# Patient Record
Sex: Female | Born: 1970 | Race: Black or African American | Hispanic: No | Marital: Married | Smoking: Never smoker
Health system: Southern US, Community
[De-identification: ages and names within clinical notes are randomized; demographics above are authoritative.]

## PROBLEM LIST (undated history)

## (undated) DIAGNOSIS — K8020 Calculus of gallbladder without cholecystitis without obstruction: Secondary | ICD-10-CM

## (undated) HISTORY — PX: ECTOPIC PREGNANCY SURGERY: SHX613

---

## 2002-01-25 ENCOUNTER — Emergency Department (HOSPITAL_COMMUNITY): Admission: EM | Admit: 2002-01-25 | Discharge: 2002-01-26 | Payer: Self-pay | Admitting: *Deleted

## 2002-05-14 ENCOUNTER — Emergency Department (HOSPITAL_COMMUNITY): Admission: EM | Admit: 2002-05-14 | Discharge: 2002-05-14 | Payer: Self-pay | Admitting: *Deleted

## 2002-07-03 ENCOUNTER — Inpatient Hospital Stay (HOSPITAL_COMMUNITY): Admission: AD | Admit: 2002-07-03 | Discharge: 2002-07-03 | Payer: Self-pay | Admitting: Family Medicine

## 2002-10-25 ENCOUNTER — Emergency Department (HOSPITAL_COMMUNITY): Admission: EM | Admit: 2002-10-25 | Discharge: 2002-10-25 | Payer: Self-pay | Admitting: Emergency Medicine

## 2003-01-06 ENCOUNTER — Emergency Department (HOSPITAL_COMMUNITY): Admission: EM | Admit: 2003-01-06 | Discharge: 2003-01-06 | Payer: Self-pay | Admitting: Emergency Medicine

## 2003-06-06 ENCOUNTER — Emergency Department (HOSPITAL_COMMUNITY): Admission: EM | Admit: 2003-06-06 | Discharge: 2003-06-06 | Payer: Self-pay | Admitting: Emergency Medicine

## 2003-07-10 ENCOUNTER — Emergency Department (HOSPITAL_COMMUNITY): Admission: EM | Admit: 2003-07-10 | Discharge: 2003-07-11 | Payer: Self-pay | Admitting: Emergency Medicine

## 2003-07-25 ENCOUNTER — Emergency Department (HOSPITAL_COMMUNITY): Admission: EM | Admit: 2003-07-25 | Discharge: 2003-07-25 | Payer: Self-pay | Admitting: Emergency Medicine

## 2003-09-26 ENCOUNTER — Emergency Department (HOSPITAL_COMMUNITY): Admission: EM | Admit: 2003-09-26 | Discharge: 2003-09-26 | Payer: Self-pay | Admitting: Emergency Medicine

## 2003-10-09 ENCOUNTER — Emergency Department (HOSPITAL_COMMUNITY): Admission: EM | Admit: 2003-10-09 | Discharge: 2003-10-09 | Payer: Self-pay | Admitting: Emergency Medicine

## 2003-12-18 ENCOUNTER — Emergency Department (HOSPITAL_COMMUNITY): Admission: EM | Admit: 2003-12-18 | Discharge: 2003-12-18 | Payer: Self-pay | Admitting: Emergency Medicine

## 2003-12-20 ENCOUNTER — Emergency Department (HOSPITAL_COMMUNITY): Admission: EM | Admit: 2003-12-20 | Discharge: 2003-12-20 | Payer: Self-pay | Admitting: Family Medicine

## 2003-12-30 ENCOUNTER — Emergency Department (HOSPITAL_COMMUNITY): Admission: EM | Admit: 2003-12-30 | Discharge: 2003-12-30 | Payer: Self-pay | Admitting: Emergency Medicine

## 2003-12-31 ENCOUNTER — Emergency Department (HOSPITAL_COMMUNITY): Admission: EM | Admit: 2003-12-31 | Discharge: 2003-12-31 | Payer: Self-pay | Admitting: *Deleted

## 2004-09-27 ENCOUNTER — Emergency Department (HOSPITAL_COMMUNITY): Admission: EM | Admit: 2004-09-27 | Discharge: 2004-09-28 | Payer: Self-pay | Admitting: Emergency Medicine

## 2004-11-08 ENCOUNTER — Encounter: Admission: RE | Admit: 2004-11-08 | Discharge: 2004-11-08 | Payer: Self-pay | Admitting: Family Medicine

## 2005-04-20 ENCOUNTER — Emergency Department (HOSPITAL_COMMUNITY): Admission: EM | Admit: 2005-04-20 | Discharge: 2005-04-20 | Payer: Self-pay | Admitting: Emergency Medicine

## 2005-08-17 ENCOUNTER — Emergency Department (HOSPITAL_COMMUNITY): Admission: EM | Admit: 2005-08-17 | Discharge: 2005-08-17 | Payer: Self-pay | Admitting: Emergency Medicine

## 2006-03-27 ENCOUNTER — Emergency Department (HOSPITAL_COMMUNITY): Admission: EM | Admit: 2006-03-27 | Discharge: 2006-03-27 | Payer: Self-pay | Admitting: Emergency Medicine

## 2006-04-15 ENCOUNTER — Emergency Department (HOSPITAL_COMMUNITY): Admission: EM | Admit: 2006-04-15 | Discharge: 2006-04-15 | Payer: Self-pay | Admitting: Family Medicine

## 2008-01-23 ENCOUNTER — Emergency Department (HOSPITAL_COMMUNITY): Admission: EM | Admit: 2008-01-23 | Discharge: 2008-01-23 | Payer: Self-pay | Admitting: Emergency Medicine

## 2008-04-17 ENCOUNTER — Emergency Department (HOSPITAL_COMMUNITY): Admission: EM | Admit: 2008-04-17 | Discharge: 2008-04-17 | Payer: Self-pay | Admitting: Emergency Medicine

## 2008-06-01 ENCOUNTER — Emergency Department (HOSPITAL_COMMUNITY): Admission: EM | Admit: 2008-06-01 | Discharge: 2008-06-01 | Payer: Self-pay | Admitting: Emergency Medicine

## 2008-09-28 ENCOUNTER — Emergency Department (HOSPITAL_COMMUNITY): Admission: EM | Admit: 2008-09-28 | Discharge: 2008-09-28 | Payer: Self-pay | Admitting: Family Medicine

## 2008-10-05 ENCOUNTER — Emergency Department (HOSPITAL_COMMUNITY): Admission: EM | Admit: 2008-10-05 | Discharge: 2008-10-05 | Payer: Self-pay | Admitting: Internal Medicine

## 2008-11-27 ENCOUNTER — Emergency Department (HOSPITAL_COMMUNITY): Admission: EM | Admit: 2008-11-27 | Discharge: 2008-11-27 | Payer: Self-pay | Admitting: Emergency Medicine

## 2009-02-01 ENCOUNTER — Emergency Department (HOSPITAL_COMMUNITY): Admission: EM | Admit: 2009-02-01 | Discharge: 2009-02-01 | Payer: Self-pay | Admitting: Emergency Medicine

## 2009-02-12 ENCOUNTER — Emergency Department (HOSPITAL_COMMUNITY): Admission: EM | Admit: 2009-02-12 | Discharge: 2009-02-12 | Payer: Self-pay | Admitting: Emergency Medicine

## 2009-05-01 ENCOUNTER — Emergency Department (HOSPITAL_COMMUNITY): Admission: EM | Admit: 2009-05-01 | Discharge: 2009-05-01 | Payer: Self-pay | Admitting: Emergency Medicine

## 2009-08-19 ENCOUNTER — Emergency Department (HOSPITAL_COMMUNITY): Admission: EM | Admit: 2009-08-19 | Discharge: 2009-08-19 | Payer: Self-pay | Admitting: Emergency Medicine

## 2010-01-09 ENCOUNTER — Emergency Department (HOSPITAL_COMMUNITY): Admission: EM | Admit: 2010-01-09 | Discharge: 2010-01-09 | Payer: Self-pay | Admitting: Emergency Medicine

## 2010-05-17 ENCOUNTER — Emergency Department (HOSPITAL_COMMUNITY): Admission: EM | Admit: 2010-05-17 | Discharge: 2010-05-17 | Payer: Self-pay | Admitting: Emergency Medicine

## 2010-10-26 LAB — DIFFERENTIAL
Basophils Relative: 0 % (ref 0–1)
Eosinophils Absolute: 0.3 10*3/uL (ref 0.0–0.7)
Eosinophils Relative: 4 % (ref 0–5)
Lymphs Abs: 1.6 10*3/uL (ref 0.7–4.0)
Monocytes Absolute: 0.5 10*3/uL (ref 0.1–1.0)

## 2010-10-26 LAB — POCT CARDIAC MARKERS
CKMB, poc: <1 ng/mL — ABNORMAL LOW (ref 1.0–8.0)
Myoglobin, poc: 31.8 ng/mL (ref 12–200)
Troponin i, poc: <0.05 ng/mL (ref 0.00–0.09)

## 2010-10-26 LAB — CBC: Platelets: 311 10*3/uL (ref 150–400)

## 2010-10-26 LAB — POCT I-STAT, CHEM 8
BUN: 13 mg/dL (ref 6–23)
Chloride: 105 mEq/L (ref 96–112)
HCT: 34 % — ABNORMAL LOW (ref 36.0–46.0)
Potassium: 4.4 mEq/L (ref 3.5–5.1)
Sodium: 135 mEq/L (ref 135–145)
TCO2: 23 mmol/L (ref 0–100)

## 2010-10-29 LAB — DIFFERENTIAL
Basophils Absolute: 0.1 10*3/uL (ref 0.0–0.1)
Eosinophils Relative: 4 % (ref 0–5)
Lymphocytes Relative: 33 % (ref 12–46)
Lymphs Abs: 1.4 10*3/uL (ref 0.7–4.0)
Monocytes Absolute: 0.4 10*3/uL (ref 0.1–1.0)
Monocytes Absolute: 0.5 10*3/uL (ref 0.1–1.0)
Monocytes Relative: 7 % (ref 3–12)
Neutro Abs: 5.7 10*3/uL (ref 1.7–7.7)
Neutrophils Relative %: 74 % (ref 43–77)

## 2010-10-29 LAB — CBC
HCT: 34.4 % — ABNORMAL LOW (ref 36.0–46.0)
HCT: 36.9 % (ref 36.0–46.0)
Hemoglobin: 11.3 g/dL — ABNORMAL LOW (ref 12.0–15.0)
Hemoglobin: 12.5 g/dL (ref 12.0–15.0)
MCHC: 33.7 g/dL (ref 30.0–36.0)
MCV: 88.9 fL (ref 78.0–100.0)
Platelets: 290 10*3/uL (ref 150–400)
RBC: 4.16 MIL/uL (ref 3.87–5.11)
RDW: 14.3 % (ref 11.5–15.5)
RDW: 13.6 % (ref 11.5–15.5)
WBC: 7.7 10*3/uL (ref 4.0–10.5)
WBC: 6.7 10*3/uL (ref 4.0–10.5)

## 2010-10-29 LAB — COMPREHENSIVE METABOLIC PANEL
Alkaline Phosphatase: 43 U/L (ref 39–117)
BUN: 8 mg/dL (ref 6–23)
GFR calc Af Amer: >60 mL/min (ref >60)
Total Bilirubin: 0.7 mg/dL (ref 0.3–1.2)
Total Protein: 6.6 g/dL (ref 6.0–8.3)

## 2010-10-29 LAB — BASIC METABOLIC PANEL
BUN: 9 mg/dL (ref 6–23)
Calcium: 8.8 mg/dL (ref 8.4–10.5)
Chloride: 103 mEq/L (ref 96–112)
Creatinine, Ser: 0.69 mg/dL (ref 0.4–1.2)
GFR calc non Af Amer: >60 mL/min (ref >60)
Glucose, Bld: 87 mg/dL (ref 70–99)
Potassium: 3.5 mEq/L (ref 3.5–5.1)

## 2010-10-29 LAB — D-DIMER, QUANTITATIVE: D-Dimer, Quant: 0.43 ug/mL-FEU (ref 0.00–0.48)

## 2010-10-29 LAB — POCT CARDIAC MARKERS
CKMB, poc: <1 ng/mL — ABNORMAL LOW (ref 1.0–8.0)
Myoglobin, poc: 41.9 ng/mL (ref 12–200)
Myoglobin, poc: 43.8 ng/mL (ref 12–200)
Troponin i, poc: <0.05 ng/mL (ref 0.00–0.09)
Troponin i, poc: <0.05 ng/mL (ref 0.00–0.09)

## 2010-10-31 LAB — POCT I-STAT, CHEM 8
BUN: 10 mg/dL (ref 6–23)
Chloride: 104 mEq/L (ref 96–112)
Creatinine, Ser: 0.9 mg/dL (ref 0.4–1.2)
Sodium: 138 mEq/L (ref 135–145)

## 2010-10-31 LAB — URINALYSIS, ROUTINE W REFLEX MICROSCOPIC
Glucose, UA: NEGATIVE mg/dL
Hgb urine dipstick: NEGATIVE
pH: 7 (ref 5.0–8.0)

## 2010-10-31 LAB — DIFFERENTIAL
Basophils Absolute: 0 10*3/uL (ref 0.0–0.1)
Lymphocytes Relative: 14 % (ref 12–46)
Lymphs Abs: 1 10*3/uL (ref 0.7–4.0)
Monocytes Absolute: 0.4 10*3/uL (ref 0.1–1.0)
Neutro Abs: 5.8 10*3/uL (ref 1.7–7.7)

## 2010-10-31 LAB — CBC
Hemoglobin: 12.9 g/dL (ref 12.0–15.0)
RDW: 13.8 % (ref 11.5–15.5)
WBC: 7.2 10*3/uL (ref 4.0–10.5)

## 2010-10-31 LAB — POCT PREGNANCY, URINE: Preg Test, Ur: NEGATIVE

## 2010-11-02 LAB — POCT PREGNANCY, URINE: Preg Test, Ur: NEGATIVE

## 2010-11-06 ENCOUNTER — Inpatient Hospital Stay (INDEPENDENT_AMBULATORY_CARE_PROVIDER_SITE_OTHER)
Admission: RE | Admit: 2010-11-06 | Discharge: 2010-11-06 | Disposition: A | Discharge status: Home / Self Care | Payer: Self-pay | Source: Ambulatory Visit | Attending: Emergency Medicine | Admitting: Emergency Medicine

## 2010-11-06 DIAGNOSIS — R112 Nausea with vomiting, unspecified: Secondary | ICD-10-CM

## 2010-11-06 DIAGNOSIS — J40 Bronchitis, not specified as acute or chronic: Secondary | ICD-10-CM

## 2010-11-06 LAB — POCT URINALYSIS DIP (DEVICE)
Bilirubin Urine: NEGATIVE
Glucose, UA: NEGATIVE mg/dL
Ketones, ur: NEGATIVE mg/dL

## 2010-11-06 LAB — POCT PREGNANCY, URINE: Preg Test, Ur: NEGATIVE

## 2011-02-12 ENCOUNTER — Inpatient Hospital Stay (HOSPITAL_COMMUNITY)
Admission: RE | Admit: 2011-02-12 | Discharge: 2011-02-12 | Disposition: A | Discharge status: Home / Self Care | Payer: Self-pay | Source: Ambulatory Visit | Attending: Family Medicine | Admitting: Family Medicine

## 2011-04-10 ENCOUNTER — Emergency Department (HOSPITAL_COMMUNITY): Payer: Self-pay

## 2011-04-10 ENCOUNTER — Emergency Department (HOSPITAL_COMMUNITY)
Admission: EM | Admit: 2011-04-10 | Discharge: 2011-04-10 | Disposition: A | Discharge status: Home / Self Care | Payer: Self-pay | Attending: Emergency Medicine | Admitting: Emergency Medicine

## 2011-04-10 DIAGNOSIS — K2970 Gastritis, unspecified, without bleeding: Secondary | ICD-10-CM | POA: Insufficient documentation

## 2011-04-10 DIAGNOSIS — R109 Unspecified abdominal pain: Secondary | ICD-10-CM | POA: Insufficient documentation

## 2011-04-10 DIAGNOSIS — R112 Nausea with vomiting, unspecified: Secondary | ICD-10-CM | POA: Insufficient documentation

## 2011-04-10 LAB — LIPASE, BLOOD: Lipase: 27 U/L (ref 11–59)

## 2011-04-10 LAB — COMPREHENSIVE METABOLIC PANEL
Albumin: 3.6 g/dL (ref 3.5–5.2)
Alkaline Phosphatase: 58 U/L (ref 39–117)
BUN: 8 mg/dL (ref 6–23)
Chloride: 103 mEq/L (ref 96–112)
Potassium: 4.1 mEq/L (ref 3.5–5.1)
Total Bilirubin: 0.7 mg/dL (ref 0.3–1.2)

## 2011-04-10 LAB — URINALYSIS, ROUTINE W REFLEX MICROSCOPIC
Bilirubin Urine: NEGATIVE
Glucose, UA: NEGATIVE mg/dL
Ketones, ur: NEGATIVE mg/dL
pH: 5.5 (ref 5.0–8.0)

## 2011-04-10 LAB — DIFFERENTIAL
Basophils Absolute: 0.1 10*3/uL (ref 0.0–0.1)
Eosinophils Relative: 4 % (ref 0–5)
Lymphocytes Relative: 32 % (ref 12–46)
Neutro Abs: 3 10*3/uL (ref 1.7–7.7)
Neutrophils Relative %: 56 % (ref 43–77)

## 2011-04-10 LAB — CBC
HCT: 34.9 % — ABNORMAL LOW (ref 36.0–46.0)
Hemoglobin: 11.3 g/dL — ABNORMAL LOW (ref 12.0–15.0)
RBC: 4.23 MIL/uL (ref 3.87–5.11)
RDW: 14.8 % (ref 11.5–15.5)
WBC: 5.3 10*3/uL (ref 4.0–10.5)

## 2011-04-19 LAB — POCT URINALYSIS DIP (DEVICE)
Bilirubin Urine: NEGATIVE
Glucose, UA: NEGATIVE
Ketones, ur: NEGATIVE
Operator id: 235561
Specific Gravity, Urine: 1.015

## 2011-04-23 LAB — POCT PREGNANCY, URINE: Preg Test, Ur: NEGATIVE

## 2011-04-24 LAB — POCT URINALYSIS DIP (DEVICE)
Bilirubin Urine: NEGATIVE
Glucose, UA: NEGATIVE mg/dL
Nitrite: NEGATIVE

## 2011-04-24 LAB — POCT PREGNANCY, URINE: Preg Test, Ur: NEGATIVE

## 2011-04-24 LAB — GC/CHLAMYDIA PROBE AMP, GENITAL: Chlamydia, DNA Probe: NEGATIVE

## 2011-04-24 LAB — WET PREP, GENITAL: Yeast Wet Prep HPF POC: NONE SEEN

## 2011-05-17 ENCOUNTER — Emergency Department (HOSPITAL_COMMUNITY)
Admission: EM | Admit: 2011-05-17 | Discharge: 2011-05-17 | Disposition: A | Discharge status: Home / Self Care | Payer: Self-pay | Attending: Emergency Medicine | Admitting: Emergency Medicine

## 2011-05-17 ENCOUNTER — Emergency Department (HOSPITAL_COMMUNITY): Payer: Self-pay

## 2011-05-17 DIAGNOSIS — R10811 Right upper quadrant abdominal tenderness: Secondary | ICD-10-CM | POA: Insufficient documentation

## 2011-05-17 DIAGNOSIS — R109 Unspecified abdominal pain: Secondary | ICD-10-CM | POA: Insufficient documentation

## 2011-05-17 LAB — COMPREHENSIVE METABOLIC PANEL
AST: 13 U/L (ref 0–37)
BUN: 10 mg/dL (ref 6–23)
CO2: 22 mEq/L (ref 19–32)
Calcium: 9.1 mg/dL (ref 8.4–10.5)
Creatinine, Ser: 0.65 mg/dL (ref 0.50–1.10)
GFR calc Af Amer: >90 mL/min (ref >90)
GFR calc non Af Amer: >90 mL/min (ref >90)
Total Bilirubin: 0.3 mg/dL (ref 0.3–1.2)

## 2011-05-17 LAB — CBC
HCT: 34.5 % — ABNORMAL LOW (ref 36.0–46.0)
MCH: 27.3 pg (ref 26.0–34.0)
MCV: 82.7 fL (ref 78.0–100.0)
Platelets: 301 10*3/uL (ref 150–400)
RBC: 4.17 MIL/uL (ref 3.87–5.11)

## 2011-05-17 LAB — PREGNANCY, URINE: Preg Test, Ur: NEGATIVE

## 2011-05-17 LAB — URINALYSIS, ROUTINE W REFLEX MICROSCOPIC
Leukocytes, UA: NEGATIVE
Nitrite: NEGATIVE
Specific Gravity, Urine: 1.027 (ref 1.005–1.030)
Urobilinogen, UA: 0.2 mg/dL (ref 0.0–1.0)
pH: 5.5 (ref 5.0–8.0)

## 2011-05-17 LAB — DIFFERENTIAL
Eosinophils Absolute: 0.2 10*3/uL (ref 0.0–0.7)
Eosinophils Relative: 3 % (ref 0–5)
Lymphocytes Relative: 31 % (ref 12–46)
Lymphs Abs: 1.9 10*3/uL (ref 0.7–4.0)
Monocytes Relative: 6 % (ref 3–12)
Neutrophils Relative %: 59 % (ref 43–77)

## 2011-05-17 MED ORDER — IOHEXOL 300 MG/ML  SOLN
80.0000 mL | Freq: Once | INTRAMUSCULAR | Status: AC | PRN
  Administered 2011-05-17: 80 mL via INTRAVENOUS

## 2011-06-04 ENCOUNTER — Emergency Department (HOSPITAL_COMMUNITY): Payer: Self-pay

## 2011-06-04 ENCOUNTER — Emergency Department (HOSPITAL_COMMUNITY)
Admission: EM | Admit: 2011-06-04 | Discharge: 2011-06-04 | Disposition: A | Discharge status: Home / Self Care | Payer: Self-pay | Attending: Emergency Medicine | Admitting: Emergency Medicine

## 2011-06-04 ENCOUNTER — Telehealth (HOSPITAL_COMMUNITY): Payer: Self-pay | Admitting: Emergency Medicine

## 2011-06-04 ENCOUNTER — Ambulatory Visit (INDEPENDENT_AMBULATORY_CARE_PROVIDER_SITE_OTHER): Payer: Self-pay | Admitting: General Surgery

## 2011-06-04 DIAGNOSIS — K279 Peptic ulcer, site unspecified, unspecified as acute or chronic, without hemorrhage or perforation: Secondary | ICD-10-CM | POA: Insufficient documentation

## 2011-06-04 DIAGNOSIS — R1013 Epigastric pain: Secondary | ICD-10-CM | POA: Insufficient documentation

## 2011-06-04 DIAGNOSIS — R112 Nausea with vomiting, unspecified: Secondary | ICD-10-CM | POA: Insufficient documentation

## 2011-06-04 HISTORY — DX: Calculus of gallbladder without cholecystitis without obstruction: K80.20

## 2011-06-04 LAB — COMPREHENSIVE METABOLIC PANEL
ALT: 7 U/L (ref 0–35)
Albumin: 3.5 g/dL (ref 3.5–5.2)
Alkaline Phosphatase: 49 U/L (ref 39–117)
BUN: 7 mg/dL (ref 6–23)
Chloride: 102 mEq/L (ref 96–112)
Glucose, Bld: 102 mg/dL — ABNORMAL HIGH (ref 70–99)
Potassium: 3.8 mEq/L (ref 3.5–5.1)
Sodium: 135 mEq/L (ref 135–145)
Total Bilirubin: 0.7 mg/dL (ref 0.3–1.2)
Total Protein: 7.1 g/dL (ref 6.0–8.3)

## 2011-06-04 LAB — CBC
Hemoglobin: 10.8 g/dL — ABNORMAL LOW (ref 12.0–15.0)
Platelets: 274 10*3/uL (ref 150–400)
RBC: 4.04 MIL/uL (ref 3.87–5.11)
WBC: 5.1 10*3/uL (ref 4.0–10.5)

## 2011-06-04 LAB — URINALYSIS, ROUTINE W REFLEX MICROSCOPIC
Bilirubin Urine: NEGATIVE
Hgb urine dipstick: NEGATIVE
Ketones, ur: NEGATIVE mg/dL
Nitrite: NEGATIVE
Specific Gravity, Urine: 1.013 (ref 1.005–1.030)
pH: 6 (ref 5.0–8.0)

## 2011-06-04 LAB — DIFFERENTIAL
Lymphs Abs: 1.5 10*3/uL (ref 0.7–4.0)
Monocytes Relative: 8 % (ref 3–12)
Neutro Abs: 3 10*3/uL (ref 1.7–7.7)
Neutrophils Relative %: 58 % (ref 43–77)

## 2011-06-04 LAB — LIPASE, BLOOD: Lipase: 25 U/L (ref 11–59)

## 2011-06-04 MED ORDER — MORPHINE SULFATE 4 MG/ML IJ SOLN
6.0000 mg | Freq: Once | INTRAMUSCULAR | Status: AC
  Administered 2011-06-04: 6 mg via INTRAVENOUS
  Filled 2011-06-04: qty 2

## 2011-06-04 MED ORDER — ONDANSETRON HCL 4 MG/2ML IJ SOLN
4.0000 mg | Freq: Once | INTRAMUSCULAR | Status: AC
  Administered 2011-06-04: 4 mg via INTRAVENOUS
  Filled 2011-06-04: qty 2

## 2011-06-04 MED ORDER — GI COCKTAIL ~~LOC~~
30.0000 mL | Freq: Once | ORAL | Status: AC
  Administered 2011-06-04: 30 mL via ORAL
  Filled 2011-06-04: qty 30

## 2011-06-04 MED ORDER — OMEPRAZOLE 20 MG PO CPDR: 40.0000 mg | DELAYED_RELEASE_CAPSULE | Freq: Every day | ORAL | Status: DC

## 2011-06-04 MED ORDER — HYDROCODONE-ACETAMINOPHEN 5-325 MG PO TABS: 1.0000 | ORAL_TABLET | ORAL | Status: AC | PRN

## 2011-06-04 MED ORDER — SODIUM CHLORIDE 0.9 % IV SOLN
Freq: Once | INTRAVENOUS | Status: AC
  Administered 2011-06-04: 12:00:00 via INTRAVENOUS

## 2011-06-04 MED ORDER — SODIUM CHLORIDE 0.9 % IV BOLUS (SEPSIS)
1000.0000 mL | Freq: Once | INTRAVENOUS | Status: AC
  Administered 2011-06-04: 1000 mL via INTRAVENOUS

## 2011-06-04 MED ORDER — ONDANSETRON HCL 4 MG PO TABS: 4.0000 mg | ORAL_TABLET | Freq: Four times a day (QID) | ORAL | Status: AC

## 2011-06-04 MED ORDER — FENTANYL CITRATE 0.05 MG/ML IJ SOLN
75.0000 ug | Freq: Once | INTRAMUSCULAR | Status: AC
  Administered 2011-06-04: 75 ug via INTRAVENOUS
  Filled 2011-06-04: qty 2

## 2011-06-04 MED ORDER — HYDROMORPHONE HCL PF 1 MG/ML IJ SOLN
0.5000 mg | Freq: Once | INTRAMUSCULAR | Status: AC
  Administered 2011-06-04: 0.5 mg via INTRAVENOUS
  Filled 2011-06-04: qty 1

## 2011-06-04 MED ORDER — OXYCODONE-ACETAMINOPHEN 5-325 MG PO TABS
1.0000 | ORAL_TABLET | Freq: Once | ORAL | Status: AC
  Administered 2011-06-04: 1 via ORAL
  Filled 2011-06-04: qty 1

## 2011-06-04 MED ORDER — PANTOPRAZOLE SODIUM 40 MG IV SOLR
40.0000 mg | Freq: Once | INTRAVENOUS | Status: AC
  Administered 2011-06-04: 40 mg via INTRAVENOUS
  Filled 2011-06-04: qty 40

## 2011-06-04 NOTE — ED Notes (Signed)
Pt. Returned from U/S via w/c, NAD noted

## 2011-06-04 NOTE — ED Notes (Signed)
Patient presents with bilateral upper quadrant pain x 1 month. Patient is to have her gall bladder removed in 3 weeks but reports pain is unbearable. Denies urinary symptoms.

## 2011-06-04 NOTE — ED Provider Notes (Signed)
History     CSN: [id number] Arrival date & time: 06/04/2011  9:14 AM   First MD Initiated Contact with Patient 06/04/11 [id number]      Chief Complaint  Patient presents with  . Abdominal Pain    (Consider location/radiation/quality/duration/timing/severity/associated sxs/prior treatment) HPI Comments: Patient presents with persistent and worsening upper abdominal pain over the last one month.  She states she was here at the end of October and diagnosed with gallstones.  She was given followup with the [facility] and was supposed to see them in office today.  She called them and stated that her pain was severe and they prompted her to come to the emergency department.  The patient had been under the impression she would have surgery today.  Patient denies any fevers.  She states that her pain is primarily in the epigastric and right upper quadrant area.  It can worsen with food but occurs even without eating.  She's had associated nausea and vomiting without any hematemesis.  Her bowel movements have been normal.  No hematuria or dysuria.  Her last menstrual period was November 1.  Patient is a 40 y.o. female presenting with abdominal pain. The history is provided by the patient. No language interpreter was used.  Abdominal Pain The primary symptoms of the illness include abdominal pain, nausea and vomiting. The primary symptoms of the illness do not include fever, fatigue, shortness of breath, diarrhea, hematemesis, hematochezia, dysuria, vaginal discharge or vaginal bleeding. The current episode started more than 2 days ago. The onset of the illness was gradual. The problem has been gradually worsening.  The patient states that she believes she is currently not pregnant. The patient has not had a change in bowel habit. Symptoms associated with the illness do not include chills, anorexia, diaphoresis, heartburn, constipation, urgency, hematuria, frequency or back pain. Significant  associated medical issues include gallstones.    Past Medical History  Diagnosis Date  . Gallstones     Past Surgical History  Procedure Date  . Ectopic pregnancy surgery     History reviewed. No pertinent family history.  History  Substance Use Topics  . Smoking status: Never Smoker   . Smokeless tobacco: Not on file  . Alcohol Use: No    OB History    Grav Para Term Preterm Abortions TAB SAB Ect Mult Living                  Review of Systems  Constitutional: Negative.  Negative for fever, chills, diaphoresis and fatigue.  HENT: Negative.   Eyes: Negative.  Negative for discharge and redness.  Respiratory: Negative.  Negative for cough and shortness of breath.   Cardiovascular: Negative.  Negative for chest pain.  Gastrointestinal: Positive for nausea, vomiting and abdominal pain. Negative for heartburn, diarrhea, constipation, hematochezia, anorexia and hematemesis.  Genitourinary: Negative.  Negative for dysuria, urgency, frequency, hematuria, vaginal bleeding and vaginal discharge.  Musculoskeletal: Negative.  Negative for back pain.  Skin: Negative.  Negative for color change and rash.  Neurological: Negative.  Negative for syncope and headaches.  Hematological: Negative.  Negative for adenopathy.  Psychiatric/Behavioral: Negative.  Negative for confusion.  All other systems reviewed and are negative.    Allergies  Codeine  Home Medications   Current Outpatient Rx  Name Route Sig Dispense Refill  . ALEVE PO Oral Take 4 tablets by mouth 2 (two) times daily as needed. For pain.       BP 112/66  Pulse 65  Temp(Src) 98 F (36.7 C) (Oral)  Resp 18  SpO2 100%  LMP 05/24/2011  Physical Exam  Constitutional: She is oriented to person, place, and time. She appears well-developed and well-nourished.  Non-toxic appearance. She does not have a sickly appearance.  HENT:  Head: Normocephalic and atraumatic.  Eyes: Conjunctivae, EOM and lids are normal. Pupils  are equal, round, and reactive to light. No scleral icterus.  Neck: Trachea normal and normal range of motion. Neck supple.  Cardiovascular: Normal rate, regular rhythm and normal heart sounds.  Exam reveals no gallop and no friction rub.   No murmur heard. Pulmonary/Chest: Effort normal and breath sounds normal. No respiratory distress. She has no wheezes. She has no rales. She exhibits no tenderness.  Abdominal: Soft. Normal appearance. There is tenderness in the right upper quadrant and epigastric area. There is positive Murphy's sign. There is no rebound, no guarding and no CVA tenderness.  Musculoskeletal: Normal range of motion.  Neurological: She is alert and oriented to person, place, and time. She has normal strength.  Skin: Skin is warm, dry and intact. No rash noted.  Psychiatric: She has a normal mood and affect. Her behavior is normal. Judgment and thought content normal.    ED Course  Procedures (including critical care time)  Results for orders placed during the hospital encounter of 06/04/11  CBC      Component Value Range   WBC 5.1  4.0 - 10.5 (K/uL)   RBC 4.04  3.87 - 5.11 (MIL/uL)   Hemoglobin 10.8 (*) 12.0 - 15.0 (g/dL)   HCT 45.4 (*) 09.8 - 46.0 (%)   MCV 82.7  78.0 - 100.0 (fL)   MCH 26.7  26.0 - 34.0 (pg)   MCHC 32.3  30.0 - 36.0 (g/dL)   RDW 11.9  14.7 - 82.9 (%)   Platelets 274  150 - 400 (K/uL)  DIFFERENTIAL      Component Value Range   Neutrophils Relative 58  43 - 77 (%)   Neutro Abs 3.0  1.7 - 7.7 (K/uL)   Lymphocytes Relative 30  12 - 46 (%)   Lymphs Abs 1.5  0.7 - 4.0 (K/uL)   Monocytes Relative 8  3 - 12 (%)   Monocytes Absolute 0.4  0.1 - 1.0 (K/uL)   Eosinophils Relative 3  0 - 5 (%)   Eosinophils Absolute 0.2  0.0 - 0.7 (K/uL)   Basophils Relative 1  0 - 1 (%)   Basophils Absolute 0.0  0.0 - 0.1 (K/uL)  COMPREHENSIVE METABOLIC PANEL      Component Value Range   Sodium 135  135 - 145 (mEq/L)   Potassium 3.8  3.5 - 5.1 (mEq/L)   Chloride 102   96 - 112 (mEq/L)   CO2 26  19 - 32 (mEq/L)   Glucose, Bld 102 (*) 70 - 99 (mg/dL)   BUN 7  6 - 23 (mg/dL)   Creatinine, Ser 5.62  0.50 - 1.10 (mg/dL)   Calcium 9.2  8.4 - 13.0 (mg/dL)   Total Protein 7.1  6.0 - 8.3 (g/dL)   Albumin 3.5  3.5 - 5.2 (g/dL)   AST 11  0 - 37 (U/L)   ALT 7  0 - 35 (U/L)   Alkaline Phosphatase 49  39 - 117 (U/L)   Total Bilirubin 0.7  0.3 - 1.2 (mg/dL)   GFR calc non Af Amer >90  >90 (mL/min)   GFR calc Af Amer >90  >90 (mL/min)  LIPASE, BLOOD  Component Value Range   Lipase 25  11 - 59 (U/L)  URINALYSIS, ROUTINE W REFLEX MICROSCOPIC      Component Value Range   Color, Urine YELLOW  YELLOW    Appearance CLEAR  CLEAR    Specific Gravity, Urine 1.013  1.005 - 1.030    pH 6.0  5.0 - 8.0    Glucose, UA NEGATIVE  NEGATIVE (mg/dL)   Hgb urine dipstick NEGATIVE  NEGATIVE    Bilirubin Urine NEGATIVE  NEGATIVE    Ketones, ur NEGATIVE  NEGATIVE (mg/dL)   Protein, ur NEGATIVE  NEGATIVE (mg/dL)   Urobilinogen, UA 0.2  0.0 - 1.0 (mg/dL)   Nitrite NEGATIVE  NEGATIVE    Leukocytes, UA NEGATIVE  NEGATIVE   PREGNANCY, URINE      Component Value Range   Preg Test, Ur NEGATIVE     US Abdomen Complete  06/04/2011  *RADIOLOGY REPORT*  Clinical Data:  Rule out cholecystitis.  Pain.  ABDOMINAL ULTRASOUND COMPLETE  Comparison:  05/17/2011  Findings:  Gallbladder:  No gallstones, gallbladder wall thickening, or pericholecystic fluid.  Common Bile Duct:  Within normal limits in caliber.  Liver: No focal mass lesion identified.  Within normal limits in parenchymal echogenicity.  IVC:  Appears normal.  Pancreas:  No abnormality identified.  Spleen:  Within normal limits in size and echotexture.  Right kidney:  Normal in size and parenchymal echogenicity.  No evidence of mass or hydronephrosis.  Left kidney:  Normal in size and parenchymal echogenicity.  No evidence of mass or hydronephrosis.  Abdominal Aorta:  No aneurysm identified.  IMPRESSION: Negative abdominal  ultrasound.  Original Report Authenticated By: Rosealee Albee, M.D.   Ct Abdomen Pelvis W Contrast  05/17/2011  *RADIOLOGY REPORT*  Clinical Data: Upper abdominal pain for 1 week.  CT ABDOMEN AND PELVIS WITH CONTRAST  Technique:  Multidetector CT imaging of the abdomen and pelvis was performed following the standard protocol during bolus administration of intravenous contrast.  Contrast: 80mL OMNIPAQUE IOHEXOL 300 MG/ML IV SOLN  Comparison: None.  Findings: The lung bases are clear.  There is no pleural or pericardial effusion.  There is some high-attenuation material in the fundus of the gallbladder compatible with the presence of sludge or possibly some small stones.  No pericholecystic fluid or other evidence of inflammatory change about the gallbladder is identified.  The liver, biliary tree, adrenal glands, spleen, pancreas and left kidney are unremarkable.  A small low attenuating lesion in the lower pole the right kidney is most consistent with a cyst.  There is a small amount of free pelvic fluid. There is a rounded centrally low attenuating focus in the right adnexa with an appearance compatible with an involuting ovarian cyst measuring 1.4 cm in diameter.  No lymphadenopathy is identified.  The stomach, small large bowel and appendix appear normal.  IMPRESSION:  1.  Small amount of free pelvic fluid is likely due to ruptured right ovarian cyst. 2.  Findings compatible with small gallstones or sludge.  No evidence of cholecystitis.  Original Report Authenticated By: Bernadene Bell. D'ALESSIO, M.D.     MDM  With symptoms concerning for possible cholecystitis versus symptomatic cholelithiasis.  On evaluation of today's ultrasound she does not have any signs of gallbladder sludge or gallstones.  Furthermore there are no findings for acute cholecystitis and no LFT elevations or lipase elevations to indicate pancreatitis.  Patient's pain may be due to peptic ulcer disease.  Did get some relief with the GI  cocktail  here in addition to the protonix and other pain medications.  Her pain is somewhat improved as well as her nausea.  The patient to followup with GI for possible EGD as an outpatient.  I will place her on a PPI as well as pain meds and nausea meds for home.  I've also advised her against using any NSAIDs as an outpatient medication.        Nat Christen, MD 06/04/11 [phone]

## 2011-06-04 NOTE — ED Notes (Signed)
Pt. To U/S via w/c with NA

## 2011-06-04 NOTE — ED Notes (Signed)
Received pt. From triage in room 6, pt. Alert and oriented, C/O RUQ pain, Pt. Was schedule for Chole. today, but was called on Friday to reschedule for 2-3 weeks.

## 2011-06-08 ENCOUNTER — Telehealth: Payer: Self-pay | Admitting: Gastroenterology

## 2011-06-08 ENCOUNTER — Ambulatory Visit: Payer: Self-pay | Admitting: Gastroenterology

## 2011-06-08 NOTE — Telephone Encounter (Signed)
No cHARGE

## 2011-06-21 ENCOUNTER — Encounter (INDEPENDENT_AMBULATORY_CARE_PROVIDER_SITE_OTHER): Payer: Self-pay | Admitting: General Surgery

## 2011-06-27 ENCOUNTER — Encounter: Payer: Self-pay | Admitting: Internal Medicine

## 2011-07-02 ENCOUNTER — Ambulatory Visit: Payer: Self-pay | Admitting: Internal Medicine

## 2011-07-30 ENCOUNTER — Emergency Department (HOSPITAL_COMMUNITY)
Admission: EM | Admit: 2011-07-30 | Discharge: 2011-07-30 | Disposition: A | Discharge status: Home / Self Care | Payer: Self-pay | Attending: Emergency Medicine | Admitting: Emergency Medicine

## 2011-07-30 ENCOUNTER — Encounter (HOSPITAL_COMMUNITY): Payer: Self-pay | Admitting: Emergency Medicine

## 2011-07-30 DIAGNOSIS — R599 Enlarged lymph nodes, unspecified: Secondary | ICD-10-CM | POA: Insufficient documentation

## 2011-07-30 DIAGNOSIS — M542 Cervicalgia: Secondary | ICD-10-CM | POA: Insufficient documentation

## 2011-07-30 DIAGNOSIS — R590 Localized enlarged lymph nodes: Secondary | ICD-10-CM

## 2011-07-30 DIAGNOSIS — Z79899 Other long term (current) drug therapy: Secondary | ICD-10-CM | POA: Insufficient documentation

## 2011-07-30 MED ORDER — AMOXICILLIN 500 MG PO CAPS: 500.0000 mg | ORAL_CAPSULE | Freq: Three times a day (TID) | ORAL | Status: AC

## 2011-07-30 MED ORDER — TRAMADOL HCL 50 MG PO TABS: 50.0000 mg | ORAL_TABLET | Freq: Four times a day (QID) | ORAL | Status: AC | PRN

## 2011-07-30 NOTE — ED Notes (Signed)
Pt c/o enlarged lymph nodes on back of right side of neck; pt sts seen here last year for same but keeps coming back

## 2011-07-30 NOTE — Discharge Instructions (Signed)
 Lymphadenopathy Lymphadenopathy means disease of the lymph glands. But the term is usually used to describe swollen or enlarged lymph glands, also called lymph nodes. These are the bean-shaped organs found in many locations including the neck, underarm, and groin. Lymph glands are part of the immune system, which fights infections in your body. Lymphadenopathy can occur in just one area of the body, such as the neck, or it can be generalized, with lymph node enlargement in several areas. The nodes found in the neck are the most common sites of lymphadenopathy. CAUSES  When your immune system responds to germs (such as viruses or bacteria ), infection-fighting cells and fluid build up. This causes the glands to grow in size. This is usually not something to worry about. Sometimes, the glands themselves can become infected and inflamed. This is called lymphadenitis. Enlarged lymph nodes can be caused by many diseases:  Bacterial disease, such as strep throat or a skin infection.   Viral disease, such as a common cold.   Other germs, such as lyme disease, tuberculosis, or sexually transmitted diseases.   Cancers, such as lymphoma (cancer of the lymphatic system) or leukemia (cancer of the white blood cells).   Inflammatory diseases such as lupus or rheumatoid arthritis.   Reactions to medications.  Many of the diseases above are rare, but important. This is why you should see your caregiver if you have lymphadenopathy. SYMPTOMS   Swollen, enlarged lumps in the neck, back of the head or other locations.   Tenderness.   Warmth or redness of the skin over the lymph nodes.   Fever.  DIAGNOSIS  Enlarged lymph nodes are often near the source of infection. They can help healthcare providers diagnose your illness. For instance:   Swollen lymph nodes around the jaw might be caused by an infection in the mouth.   Enlarged glands in the neck often signal a throat infection.   Lymph nodes that  are swollen in more than one area often indicate an illness caused by a virus.  Your caregiver most likely will know what is causing your lymphadenopathy after listening to your history and examining you. Blood tests, x-rays or other tests may be needed. If the cause of the enlarged lymph node cannot be found, and it does not go away by itself, then a biopsy may be needed. Your caregiver will discuss this with you. TREATMENT  Treatment for your enlarged lymph nodes will depend on the cause. Many times the nodes will shrink to normal size by themselves, with no treatment. Antibiotics or other medicines may be needed for infection. Only take over-the-counter or prescription medicines for pain, discomfort or fever as directed by your caregiver. HOME CARE INSTRUCTIONS  Swollen lymph glands usually return to normal when the underlying medical condition goes away. If they persist, contact your health-care provider. He/she might prescribe antibiotics or other treatments, depending on the diagnosis. Take any medications exactly as prescribed. Keep any follow-up appointments made to check on the condition of your enlarged nodes.  SEEK MEDICAL CARE IF:   Swelling lasts for more than two weeks.   You have symptoms such as weight loss, night sweats, fatigue or fever that does not go away.   The lymph nodes are hard, seem fixed to the skin or are growing rapidly.   Skin over the lymph nodes is red and inflamed. This could mean there is an infection.  SEEK IMMEDIATE MEDICAL CARE IF:   Fluid starts leaking from the area of the  enlarged lymph node.   You develop a fever of 102 F (38.9 C) or greater.   Severe pain develops (not necessarily at the site of a large lymph node).   You develop chest pain or shortness of breath.   You develop worsening abdominal pain.  MAKE SURE YOU:   Understand these instructions.   Will watch your condition.   Will get help right away if you are not doing well or get  worse.  Document Released: 04/17/2008 Document Revised: 03/21/2011 Document Reviewed: 04/17/2008 [facility] Patient Information 2012 [place], [place].        You need to follow up with your primary care doctor meaning family doctor or personal physician to make sure that these enlarged lymph nodes improve.  If they are not improving and continue to get worse, you may need a biopsy to make sure these are not cancer.  Most of the time it is a virus or bacteria causing this problem and shouldn't last more than a few weeks.  Xrays do not play a role in this case, and sometimes may be needed if you have other associated symptoms such as high fevers, a stiff neck, severe headaches.

## 2011-07-30 NOTE — ED Notes (Signed)
C/o "knots" to posterior neck intermittent x 1 yr. Reports came up yesterday, causes pain  & h/a. Denies injury

## 2011-07-30 NOTE — ED Provider Notes (Signed)
History     CSN: [id number]  Arrival date & time 07/30/11  4166   First MD Initiated Contact with Patient 07/30/11 1105      Chief Complaint  Patient presents with  . Neck Pain    (Consider location/radiation/quality/duration/timing/severity/associated sxs/prior treatment) HPI Comments: Patient reports pain in the right posterior neck area. She denies fevers, neck stiffness or recent trauma. She reports that she thinks she has enlarged lymph nodes. She reports they've been there for approximately one year, did improve but now have gotten worse again over the last several days to weeks. She denies ear pain, sinus drainage, eye pain. She denies any scalp lesions. She denies any unusual weight loss. She denies any enlarged lymph nodes elsewhere on her body. She has not taken any medications for these lesions.  Patient is a 41 y.o. female presenting with neck pain. The history is provided by the patient.  Neck Pain  Pertinent negatives include no headaches.    Past Medical History  Diagnosis Date  . Gallstones     Past Surgical History  Procedure Date  . Ectopic pregnancy surgery     History reviewed. No pertinent family history.  History  Substance Use Topics  . Smoking status: Never Smoker   . Smokeless tobacco: Not on file  . Alcohol Use: No    OB History    Grav Para Term Preterm Abortions TAB SAB Ect Mult Living                  Review of Systems  Constitutional: Negative for fever and appetite change.  HENT: Positive for neck pain. Negative for congestion, sore throat, rhinorrhea, postnasal drip and sinus pressure.   Eyes: Negative for pain and discharge.  Skin: Negative for rash.  Neurological: Negative for headaches.    Allergies  Codeine  Home Medications   Current Outpatient Rx  Name Route Sig Dispense Refill  . IBUPROFEN 200 MG PO TABS Oral Take 200 mg by mouth 2 (two) times daily as needed. For pain     . OMEPRAZOLE 20 MG PO CPDR Oral Take 2  capsules (40 mg total) by mouth daily. 60 capsule 0  . OXYMETAZOLINE HCL 0.05 % NA SOLN Nasal Place 2 sprays into the nose 2 (two) times daily.      . AMOXICILLIN 500 MG PO CAPS Oral Take 1 capsule (500 mg total) by mouth 3 (three) times daily. 21 capsule 0  . TRAMADOL HCL 50 MG PO TABS Oral Take 1 tablet (50 mg total) by mouth every 6 (six) hours as needed for pain. 15 tablet 0    BP 112/71  Pulse 62  Temp(Src) 97.8 F (36.6 C) (Oral)  Resp 18  SpO2 100%  Physical Exam  Vitals reviewed. Constitutional: She is oriented to person, place, and time. She appears well-developed and well-nourished.  Eyes: Conjunctivae and EOM are normal. Pupils are equal, round, and reactive to light.  Neck: Neck supple.    Neurological: She is alert and oriented to person, place, and time. No cranial nerve deficit. GCS eye subscore is 4. GCS verbal subscore is 5. GCS motor subscore is 6.  Skin: Skin is warm and dry. No rash noted.    ED Course  Procedures (including critical care time)  Labs Reviewed - No data to display No results found.   1. Postauricular lymphadenopathy       MDM  Pt instructed to follow up with a PCP, explained the dangers of potential lymphoma, although  I highly doubt as pt reports they improved and now are back.  Likely viral or bacterial.  I have given abx and she is told that not improving, to see a family physician or ENT        Gavin Pound. Page Lancon, MD 07/30/11 1121

## 2011-12-22 IMAGING — CT CT ABD-PELV W/ CM
2 of 4 series · 17 of 46 positions shown, 19 images · IV contrast (APPLIED)
Comparison: None.

CLINICAL DATA: Upper abdominal pain for 1 week.

CT ABDOMEN AND PELVIS WITH CONTRAST
TECHNIQUE: Multidetector CT imaging of the abdomen and pelvis was
performed following the standard protocol during bolus
administration of intravenous contrast.
Contrast: 80mL OMNIPAQUE IOHEXOL 300 MG/ML IV SOLN

[Series 4: abd/pelv with 5.0 b31f st · axial · 0.91mm/px · z∈[-516,-96]mm · 14 of 92 slices shown, 16 images]
[im 4/92  soft-tissue]
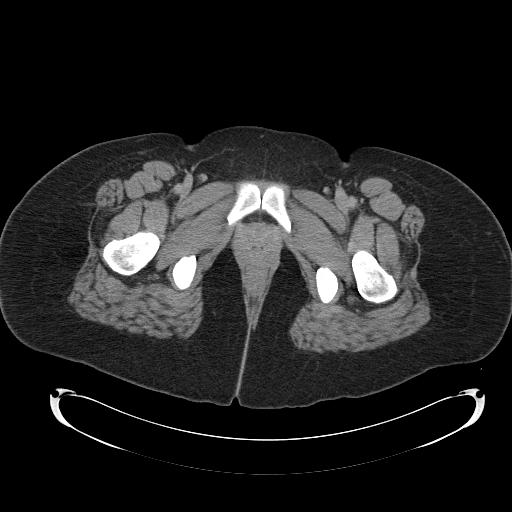
[im 4/92  bone]
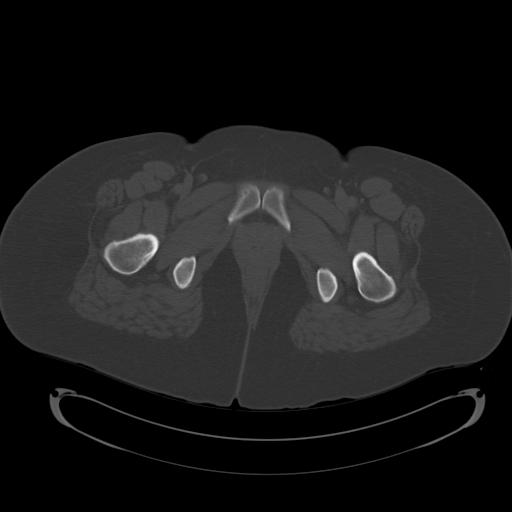
[im 12/92  soft-tissue]
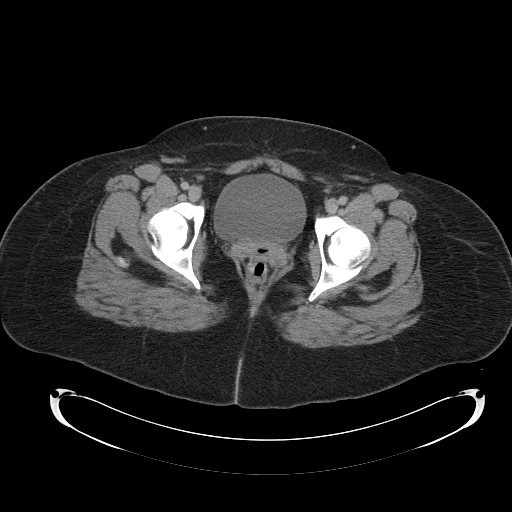
[im 19/92  soft-tissue]
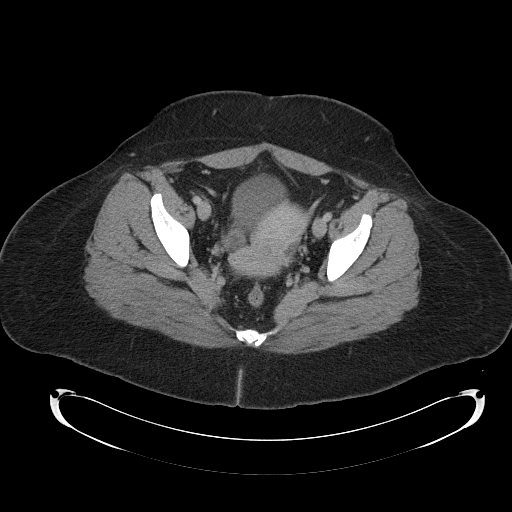
[im 23/92  soft-tissue]
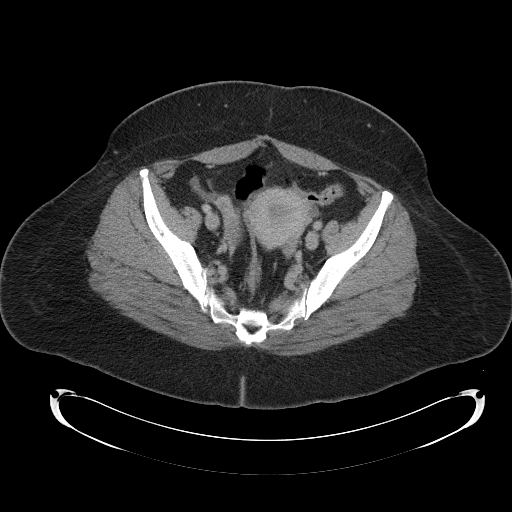
[im 31/92  soft-tissue]
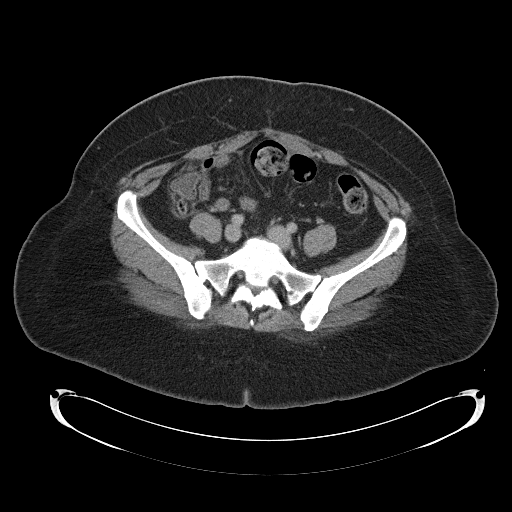
[im 38/92  soft-tissue]
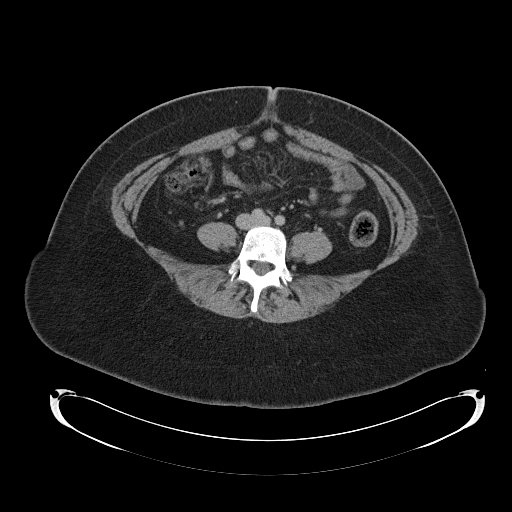
[im 42/92  soft-tissue]
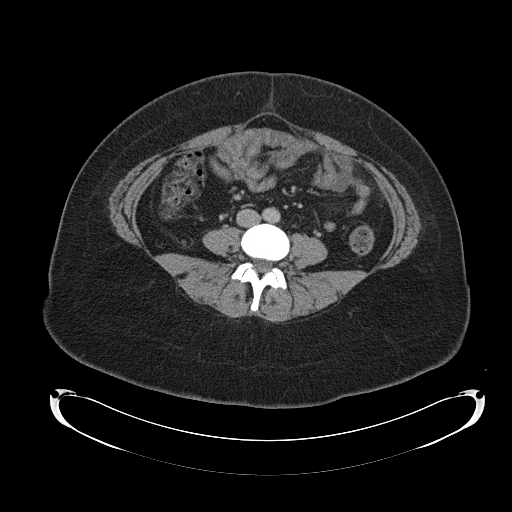
[im 50/92  soft-tissue]
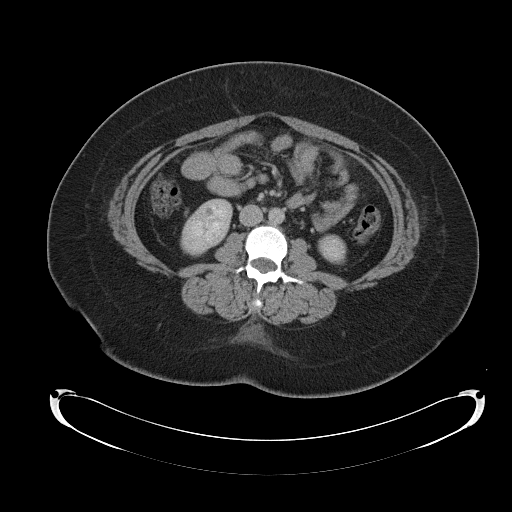
[im 54/92  soft-tissue]
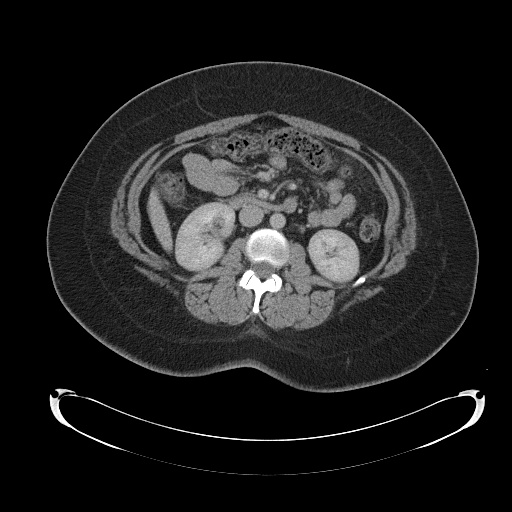
[im 54/92  bone]
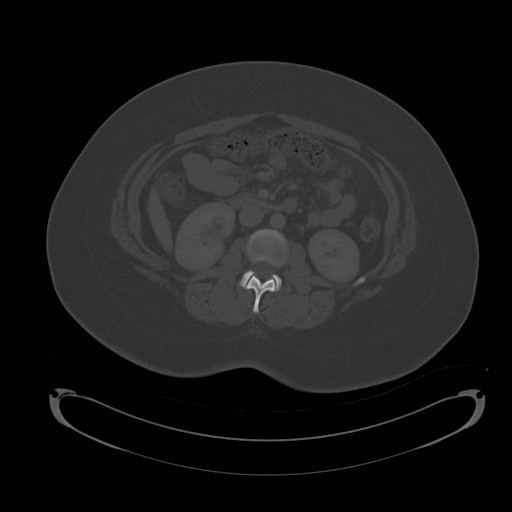
[im 61/92  soft-tissue]
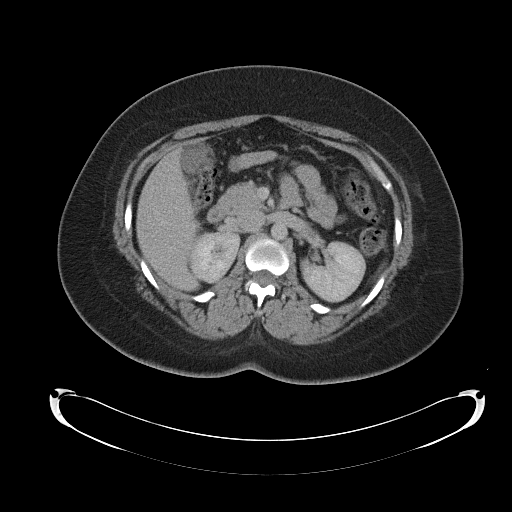
[im 69/92  soft-tissue]
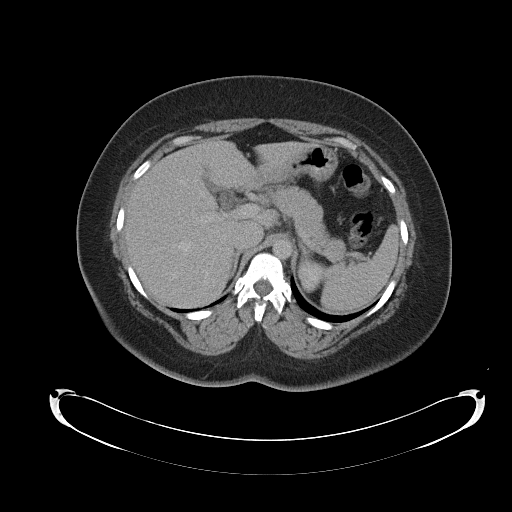
[im 73/92  soft-tissue]
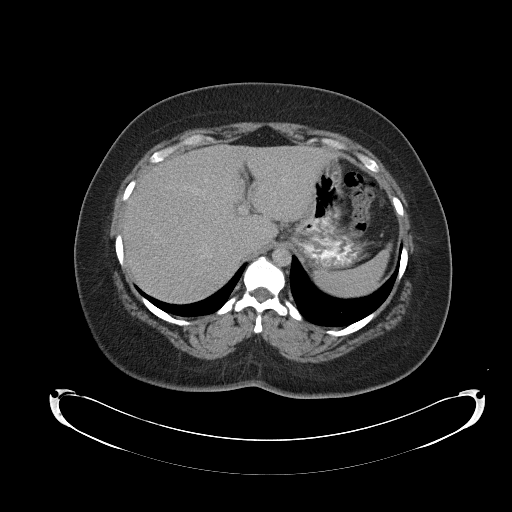
[im 80/92  soft-tissue]
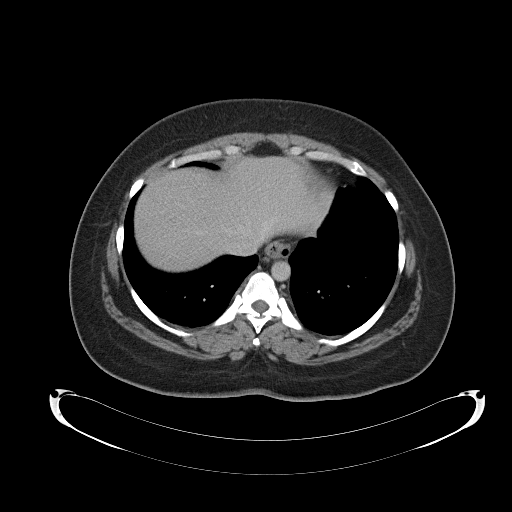
[im 88/92  soft-tissue]
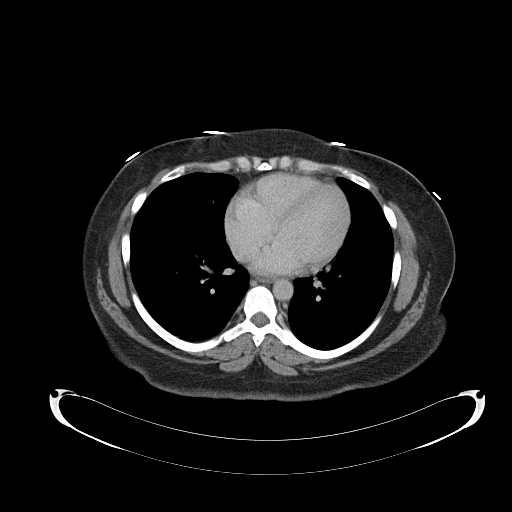

[Series 602: coronals · coronal · 0.91mm/px · 3 of 120 slices shown]
[im 40/120  soft-tissue]
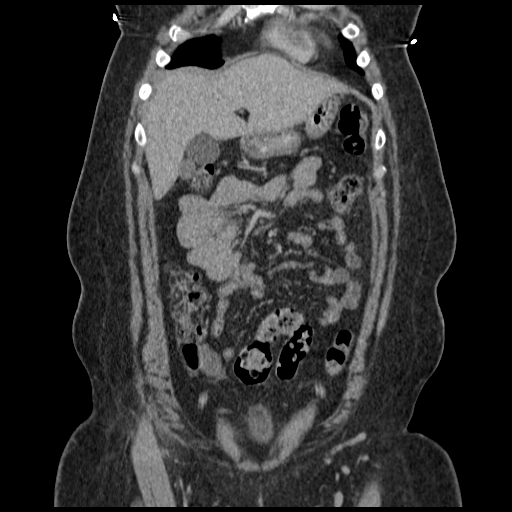
[im 53/120  soft-tissue]
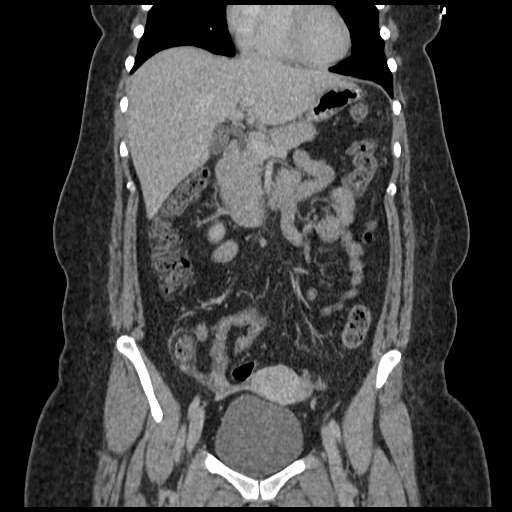
[im 67/120  soft-tissue]
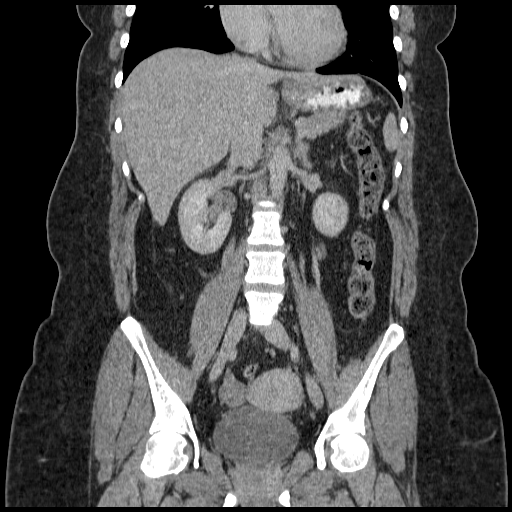

[17 of 46 positions shown; findings below may reference images not displayed]

FINDINGS: The lung bases are clear.  There is no pleural or
pericardial effusion.

There is some high-attenuation material in the fundus of the
gallbladder compatible with the presence of sludge or possibly some
small stones.  No pericholecystic fluid or other evidence of
inflammatory change about the gallbladder is identified.  The
liver, biliary tree, adrenal glands, spleen, pancreas and left
kidney are unremarkable.  A small low attenuating lesion in the
lower pole the right kidney is most consistent with a cyst.

There is a small amount of free pelvic fluid. There is a rounded
centrally low attenuating focus in the right adnexa with an
appearance compatible with an involuting ovarian cyst measuring
cm in diameter.  No lymphadenopathy is identified.  The stomach,
small large bowel and appendix appear normal.
IMPRESSION: 1.  Small amount of free pelvic fluid is likely due to ruptured
right ovarian cyst.
2.  Findings compatible with small gallstones or sludge.  No
evidence of cholecystitis.

## 2012-06-10 ENCOUNTER — Emergency Department (HOSPITAL_COMMUNITY): Payer: Self-pay

## 2012-06-10 ENCOUNTER — Emergency Department (HOSPITAL_COMMUNITY)
Admission: EM | Admit: 2012-06-10 | Discharge: 2012-06-10 | Disposition: A | Discharge status: Home / Self Care | Payer: Self-pay | Attending: Emergency Medicine | Admitting: Emergency Medicine

## 2012-06-10 ENCOUNTER — Encounter (HOSPITAL_COMMUNITY): Payer: Self-pay | Admitting: *Deleted

## 2012-06-10 DIAGNOSIS — Z9889 Other specified postprocedural states: Secondary | ICD-10-CM | POA: Insufficient documentation

## 2012-06-10 DIAGNOSIS — R109 Unspecified abdominal pain: Secondary | ICD-10-CM | POA: Insufficient documentation

## 2012-06-10 DIAGNOSIS — Z87442 Personal history of urinary calculi: Secondary | ICD-10-CM | POA: Insufficient documentation

## 2012-06-10 DIAGNOSIS — Z3202 Encounter for pregnancy test, result negative: Secondary | ICD-10-CM | POA: Insufficient documentation

## 2012-06-10 LAB — COMPREHENSIVE METABOLIC PANEL
CO2: 24 mEq/L (ref 19–32)
Calcium: 9.4 mg/dL (ref 8.4–10.5)
Creatinine, Ser: 0.62 mg/dL (ref 0.50–1.10)
GFR calc Af Amer: >90 mL/min (ref >90)
GFR calc non Af Amer: >90 mL/min (ref >90)
Glucose, Bld: 110 mg/dL — ABNORMAL HIGH (ref 70–99)
Total Protein: 8 g/dL (ref 6.0–8.3)

## 2012-06-10 LAB — URINALYSIS, ROUTINE W REFLEX MICROSCOPIC
Bilirubin Urine: NEGATIVE
Hgb urine dipstick: NEGATIVE
Ketones, ur: NEGATIVE mg/dL
Nitrite: NEGATIVE
Specific Gravity, Urine: 1.024 (ref 1.005–1.030)
Urobilinogen, UA: 0.2 mg/dL (ref 0.0–1.0)

## 2012-06-10 LAB — CBC WITH DIFFERENTIAL/PLATELET
Eosinophils Absolute: 0.2 10*3/uL (ref 0.0–0.7)
Eosinophils Relative: 3 % (ref 0–5)
HCT: 36.4 % (ref 36.0–46.0)
Lymphocytes Relative: 29 % (ref 12–46)
Lymphs Abs: 2.2 10*3/uL (ref 0.7–4.0)
MCH: 27 pg (ref 26.0–34.0)
MCV: 83.3 fL (ref 78.0–100.0)
Monocytes Absolute: 0.5 10*3/uL (ref 0.1–1.0)
RBC: 4.37 MIL/uL (ref 3.87–5.11)
RDW: 14 % (ref 11.5–15.5)
WBC: 7.5 10*3/uL (ref 4.0–10.5)

## 2012-06-10 LAB — PREGNANCY, URINE: Preg Test, Ur: NEGATIVE

## 2012-06-10 MED ORDER — ONDANSETRON HCL 4 MG/2ML IJ SOLN
4.0000 mg | Freq: Once | INTRAMUSCULAR | Status: AC
  Administered 2012-06-10: 4 mg via INTRAVENOUS
  Filled 2012-06-10 (×2): qty 2

## 2012-06-10 MED ORDER — IOHEXOL 300 MG/ML  SOLN
100.0000 mL | Freq: Once | INTRAMUSCULAR | Status: AC | PRN
  Administered 2012-06-10: 100 mL via INTRAVENOUS

## 2012-06-10 MED ORDER — HYDROMORPHONE HCL PF 1 MG/ML IJ SOLN
1.0000 mg | Freq: Once | INTRAMUSCULAR | Status: AC
  Administered 2012-06-10: 1 mg via INTRAVENOUS
  Filled 2012-06-10 (×2): qty 1

## 2012-06-10 MED ORDER — SODIUM CHLORIDE 0.9 % IV BOLUS (SEPSIS)
1000.0000 mL | Freq: Once | INTRAVENOUS | Status: AC
  Administered 2012-06-10: 1000 mL via INTRAVENOUS

## 2012-06-10 MED ORDER — TRAMADOL HCL 50 MG PO TABS: 50.0000 mg | ORAL_TABLET | Freq: Four times a day (QID) | ORAL | Status: DC | PRN

## 2012-06-10 MED ORDER — IOHEXOL 300 MG/ML  SOLN
20.0000 mL | INTRAMUSCULAR | Status: AC
  Administered 2012-06-10 (×2): 20 mL via ORAL

## 2012-06-10 NOTE — ED Notes (Signed)
Pt is here with upper left side abdominal pain.  Pt reports pain for 2 weeks.  Pt has had nausea.  Vomited yesterday.  Pt reports pain when eating in her abdomen,.  No diarrhea or constipation

## 2012-06-10 NOTE — ED Notes (Signed)
Sharp pains in upper part of abd  Ate a little today but it hurts vomited yesterday but not today denies dysuria no injury lmp 05/27/12

## 2012-06-10 NOTE — ED Provider Notes (Signed)
CT and lab results reviewed, discussed with Dr. Bernette Mayers and shared with patient/family.  No leukocytosis.  Normal LFT's.  No UTI.  CT reveals mild amount of retained stool in the colon--no evidence of bowel obstruction, appendicitis, or diverticulitis.  Right ovarian cyst noted.  Pain improved after medication in the ED. Patient discharged home with encouragement to increase fluids and fiber in diet.  Jimmye Norman, NP 06/10/12 2053

## 2012-06-10 NOTE — ED Provider Notes (Signed)
History     CSN: [id number]  Arrival date & time 06/10/12  1319   First MD Initiated Contact with Patient 06/10/12 1617      No chief complaint on file.   (Consider location/radiation/quality/duration/timing/severity/associated sxs/prior treatment) HPI Pt reports about 2 weeks of intermittent LUQ abdominal pain, sharp, severe comes and goes without particular provoking or relieving factors. Associated with nausea and vomiting at times. Non-radiating. No associated diarrhea or constipation, ? Blood in her stool recently. No dysuria, hematuria, vaginal bleeding or discharge.   Past Medical History  Diagnosis Date  . Gallstones     Past Surgical History  Procedure Date  . Ectopic pregnancy surgery     No family history on file.  History  Substance Use Topics  . Smoking status: Never Smoker   . Smokeless tobacco: Not on file  . Alcohol Use: No    OB History    Grav Para Term Preterm Abortions TAB SAB Ect Mult Living                  Review of Systems All other systems reviewed and are negative except as noted in HPI.   Allergies  Codeine  Home Medications   Current Outpatient Rx  Name  Route  Sig  Dispense  Refill  . IBUPROFEN 200 MG PO TABS   Oral   Take 200 mg by mouth 2 (two) times daily as needed. For pain          . OXYMETAZOLINE HCL 0.05 % NA SOLN   Nasal   Place 2 sprays into the nose 2 (two) times daily as needed. For allergies           BP 99/75  Pulse 86  Temp 98 F (36.7 C)  Resp 18  SpO2 99%  Physical Exam  Nursing note and vitals reviewed. Constitutional: She is oriented to person, place, and time. She appears well-developed and well-nourished.  HENT:  Head: Normocephalic and atraumatic.  Eyes: EOM are normal. Pupils are equal, round, and reactive to light.  Neck: Normal range of motion. Neck supple.  Cardiovascular: Normal rate, normal heart sounds and intact distal pulses.   Pulmonary/Chest: Effort normal and breath sounds  normal.  Abdominal: Bowel sounds are normal. She exhibits no distension. There is tenderness (LUQ). There is guarding. There is no rebound.  Musculoskeletal: Normal range of motion. She exhibits no edema and no tenderness.  Neurological: She is alert and oriented to person, place, and time. She has normal strength. No cranial nerve deficit or sensory deficit.  Skin: Skin is warm and dry. No rash noted.  Psychiatric: She has a normal mood and affect.    ED Course  Procedures (including critical care time)  Labs Reviewed  CBC WITH DIFFERENTIAL - Abnormal; Notable for the following:    Hemoglobin 11.8 (*)     All other components within normal limits  COMPREHENSIVE METABOLIC PANEL - Abnormal; Notable for the following:    Glucose, Bld 110 (*)     Total Bilirubin 0.2 (*)     All other components within normal limits  URINALYSIS, ROUTINE W REFLEX MICROSCOPIC  LIPASE, BLOOD  PREGNANCY, URINE  LAB REPORT - SCANNED   Ct Abdomen Pelvis W Contrast  06/10/2012  *RADIOLOGY REPORT*  Clinical Data: Left upper quadrant pain  CT ABDOMEN AND PELVIS WITH CONTRAST  Technique:  Multidetector CT imaging of the abdomen and pelvis was performed following the standard protocol during bolus administration of intravenous contrast.  Contrast:  OMNIPAQUE IOHEXOL 300 MG/ML  SOLN  Comparison: CT 05/17/2011  Findings: Lung bases are clear.  10 mm cyst in the right lobe of the liver.  Remaining liver is normal.  Gallbladder and bile ducts are normal.  Pancreas spleen and kidneys are normal.  Negative for bowel obstruction.  Mild amount of retained stool in the colon.  The appendix is normal.  No evidence of diverticulitis. 36 mm right adnexal cyst.  Normal uterus.  No free fluid in the pelvis.  IMPRESSION: 36 mm right ovarian cyst.  Otherwise negative.   Original Report Authenticated By: Janeece Riggers, M.D.      No diagnosis found.    MDM  PT moved to CDU for CT. Care signed out to Felicie Morn, NP. US guided  peripheral IV access done by resident.         Charles B. Bernette Mayers, MD 06/11/12 1459

## 2012-06-10 NOTE — ED Notes (Signed)
IV attempt x 2, unsuccessful. Other RN at bedside to assess.

## 2012-06-10 NOTE — ED Notes (Signed)
Last bm was last night and it was good  But hurt coming out

## 2012-09-18 ENCOUNTER — Emergency Department (HOSPITAL_COMMUNITY)
Admission: EM | Admit: 2012-09-18 | Discharge: 2012-09-18 | Disposition: A | Discharge status: Home / Self Care | Payer: Self-pay | Attending: Emergency Medicine | Admitting: Emergency Medicine

## 2012-09-18 ENCOUNTER — Encounter (HOSPITAL_COMMUNITY): Payer: Self-pay | Admitting: Emergency Medicine

## 2012-09-18 ENCOUNTER — Emergency Department (HOSPITAL_COMMUNITY): Payer: Self-pay

## 2012-09-18 DIAGNOSIS — M25519 Pain in unspecified shoulder: Secondary | ICD-10-CM | POA: Insufficient documentation

## 2012-09-18 DIAGNOSIS — R52 Pain, unspecified: Secondary | ICD-10-CM | POA: Insufficient documentation

## 2012-09-18 DIAGNOSIS — R079 Chest pain, unspecified: Secondary | ICD-10-CM | POA: Insufficient documentation

## 2012-09-18 DIAGNOSIS — M549 Dorsalgia, unspecified: Secondary | ICD-10-CM | POA: Insufficient documentation

## 2012-09-18 DIAGNOSIS — R05 Cough: Secondary | ICD-10-CM | POA: Insufficient documentation

## 2012-09-18 DIAGNOSIS — Z8719 Personal history of other diseases of the digestive system: Secondary | ICD-10-CM | POA: Insufficient documentation

## 2012-09-18 DIAGNOSIS — R059 Cough, unspecified: Secondary | ICD-10-CM | POA: Insufficient documentation

## 2012-09-18 LAB — BASIC METABOLIC PANEL
CO2: 23 mEq/L (ref 19–32)
Chloride: 105 mEq/L (ref 96–112)
GFR calc non Af Amer: >90 mL/min (ref >90)
Glucose, Bld: 90 mg/dL (ref 70–99)
Potassium: 3.9 mEq/L (ref 3.5–5.1)
Sodium: 138 mEq/L (ref 135–145)

## 2012-09-18 LAB — CBC WITH DIFFERENTIAL/PLATELET
Eosinophils Absolute: 0.2 10*3/uL (ref 0.0–0.7)
Lymphocytes Relative: 33 % (ref 12–46)
Lymphs Abs: 2.2 10*3/uL (ref 0.7–4.0)
Neutro Abs: 4 10*3/uL (ref 1.7–7.7)
Neutrophils Relative %: 58 % (ref 43–77)
Platelets: 301 10*3/uL (ref 150–400)
RBC: 4.31 MIL/uL (ref 3.87–5.11)
WBC: 6.8 10*3/uL (ref 4.0–10.5)

## 2012-09-18 LAB — POCT I-STAT TROPONIN I: Troponin i, poc: 0.01 ng/mL (ref 0.00–0.08)

## 2012-09-18 MED ORDER — HYDROMORPHONE HCL PF 1 MG/ML IJ SOLN
1.0000 mg | Freq: Once | INTRAMUSCULAR | Status: AC
  Administered 2012-09-18: 1 mg via INTRAVENOUS
  Filled 2012-09-18: qty 1

## 2012-09-18 MED ORDER — FAMOTIDINE 20 MG PO TABS: 20.0000 mg | ORAL_TABLET | Freq: Two times a day (BID) | ORAL | Status: DC

## 2012-09-18 MED ORDER — ASPIRIN 81 MG PO CHEW: 81.0000 mg | CHEWABLE_TABLET | Freq: Every day | ORAL | Status: DC

## 2012-09-18 MED ORDER — SODIUM CHLORIDE 0.9 % IV BOLUS (SEPSIS)
1000.0000 mL | Freq: Once | INTRAVENOUS | Status: AC
  Administered 2012-09-18: 1000 mL via INTRAVENOUS

## 2012-09-18 MED ORDER — IOHEXOL 350 MG/ML SOLN
60.0000 mL | Freq: Once | INTRAVENOUS | Status: AC | PRN
  Administered 2012-09-18: 60 mL via INTRAVENOUS

## 2012-09-18 MED ORDER — ONDANSETRON 4 MG PO TBDP: 8.0000 mg | ORAL_TABLET | Freq: Once | ORAL | Status: DC

## 2012-09-18 MED ORDER — GI COCKTAIL ~~LOC~~
30.0000 mL | Freq: Once | ORAL | Status: AC
  Administered 2012-09-18: 30 mL via ORAL
  Filled 2012-09-18: qty 30

## 2012-09-18 MED ORDER — LORAZEPAM 2 MG/ML IJ SOLN
1.0000 mg | Freq: Once | INTRAMUSCULAR | Status: AC
  Administered 2012-09-18: 1 mg via INTRAVENOUS
  Filled 2012-09-18: qty 1

## 2012-09-18 NOTE — ED Notes (Signed)
ED PA Theron Arista in with pt

## 2012-09-18 NOTE — ED Notes (Signed)
IV team in with pt 

## 2012-09-18 NOTE — ED Notes (Signed)
Pt c/o generalized body aches from head, neck, chest, back. No fever. Pain and neck rigidity started at 0100 this AM. NSR on monitor with pulse rate of 80 and BP 130/80.

## 2012-09-18 NOTE — ED Notes (Signed)
Pt back from CT

## 2012-09-18 NOTE — ED Notes (Signed)
Pt ambulated to BR with slow steady gait. NAD, NO SOB

## 2012-09-18 NOTE — ED Provider Notes (Signed)
Angela Williams S 8:00 PM patient discussed in sign out with Dr. Rosalia Hammers. Patient with multiple complaints and atypical symptoms of chest pains. Patient did have elevated d-dimer. Patient otherwise has no significant risk factors for PE. She has normal respirations and O2 sats. Normal heart rate. No recent long travel. No clinical signs for DVT. No previous history of DVT or PE. No hemoptysis. No birth control or estrogen. No known active forms of cancer. CT angiogram of chest is pending to rule out PE given elevated d-dimer. If negative patient may be discharged home with PCP followup  8:30 PM Patient requiring IV team to place a new the catheter to receive contrast for CT. IV team was successful and patient now ready to be taken for CT. She is complaining of return to pain and 1 mg of Dilaudid will be repeated.  Pt back from CT.  No concerning findings to explain pain.  No PE.  Pt continues to have normal vital signs.  Unconcerning ECG and labs.  Pain is atypical and pt low risk for ACS.  At this time will d/c home.      Date: 09/18/2012  Rate: 76  Rhythm: normal sinus rhythm  QRS Axis: normal  Intervals: normal  ST/T Wave abnormalities: nonspecific T wave changes  Conduction Disutrbances:none  Narrative Interpretation:   Old EKG Reviewed: none available    Angus Seller, [place] 09/19/12 [phone]

## 2012-09-18 NOTE — ED Provider Notes (Signed)
History     CSN: [id number]  Arrival date & time 09/18/12  1431   First MD Initiated Contact with Patient 09/18/12 1454      Chief Complaint  Patient presents with  . Generalized Body Aches  . Chest Pain    (Consider location/radiation/quality/duration/timing/severity/associated sxs/prior treatment) HPI  Patient states can't talk right now she has to urinate. 3:03 PM Patient states left upper anterior chest pain began this am at 0100, constant aching.  Pain in back and shoulder started today.  Pain with deep breath and cough.  Patient states had cough yesterday but non productive and no pain.  Patient denies history of pneumothorax, pneumonia, dvt, pe.  Patient did not take any meds.  Father told her to call 911.  No fever or chills.    Past Medical History  Diagnosis Date  . Gallstones     Past Surgical History  Procedure Laterality Date  . Ectopic pregnancy surgery      No family history on file.  History  Substance Use Topics  . Smoking status: Never Smoker   . Smokeless tobacco: Not on file  . Alcohol Use: No    OB History   Grav Para Term Preterm Abortions TAB SAB Ect Mult Living                  Review of Systems  All other systems reviewed and are negative.    Allergies  Codeine  Home Medications  No current outpatient prescriptions on file.  BP 120/74  Pulse 69  Temp(Src) 98 F (36.7 C) (Oral)  Resp 22  SpO2 100%  Physical Exam  Nursing note and vitals reviewed. Constitutional: She appears well-developed and well-nourished.  HENT:  Head: Normocephalic and atraumatic.  Eyes: Conjunctivae and EOM are normal. Pupils are equal, round, and reactive to light.  Neck: Normal range of motion. Neck supple.  Cardiovascular: Normal rate, regular rhythm, normal heart sounds and intact distal pulses.   Pulmonary/Chest: Effort normal and breath sounds normal.  Abdominal: Soft. Bowel sounds are normal.  Musculoskeletal: Normal range of motion.   Neurological: She is alert.  Skin: Skin is warm and dry.  Psychiatric: She has a normal mood and affect. Thought content normal.    ED Course  Procedures (including critical care time)  Labs Reviewed - No data to display No results found.   No diagnosis found.  Results for orders placed during the hospital encounter of 09/18/12  CBC WITH DIFFERENTIAL      Result Value Range   WBC 6.8  4.0 - 10.5 K/uL   RBC 4.31  3.87 - 5.11 MIL/uL   Hemoglobin 12.0  12.0 - 15.0 g/dL   HCT 40.9 (*) 81.1 - 91.4 %   MCV 82.4  78.0 - 100.0 fL   MCH 27.8  26.0 - 34.0 pg   MCHC 33.8  30.0 - 36.0 g/dL   RDW 78.2  95.6 - 21.3 %   Platelets 301  150 - 400 K/uL   Neutrophils Relative 58  43 - 77 %   Neutro Abs 4.0  1.7 - 7.7 K/uL   Lymphocytes Relative 33  12 - 46 %   Lymphs Abs 2.2  0.7 - 4.0 K/uL   Monocytes Relative 6  3 - 12 %   Monocytes Absolute 0.4  0.1 - 1.0 K/uL   Eosinophils Relative 2  0 - 5 %   Eosinophils Absolute 0.2  0.0 - 0.7 K/uL   Basophils Relative 1  0 - 1 %   Basophils Absolute 0.0  0.0 - 0.1 K/uL  BASIC METABOLIC PANEL      Result Value Range   Sodium 138  135 - 145 mEq/L   Potassium 3.9  3.5 - 5.1 mEq/L   Chloride 105  96 - 112 mEq/L   CO2 23  19 - 32 mEq/L   Glucose, Bld 90  70 - 99 mg/dL   BUN 7  6 - 23 mg/dL   Creatinine, Ser 1.61  0.50 - 1.10 mg/dL   Calcium 9.4  8.4 - 09.6 mg/dL   GFR calc non Af Amer >90  >90 mL/min   GFR calc Af Amer >90  >90 mL/min  D-DIMER, QUANTITATIVE      Result Value Range   D-Dimer, Quant 0.72 (*) 0.00 - 0.48 ug/mL-FEU     MDM  Patient has to have a CT angiogram with an elevated d-dimer and paretic type left chest pain.  Discussed with Vita Barley PA-C and he will review the CT angiogram. Patient says that she'll be discharged this is normal.       Hilario Quarry, MD 09/18/12 2019

## 2012-09-18 NOTE — ED Notes (Signed)
NAD noted, pt speaking in full sentences while on phone.

## 2012-09-18 NOTE — ED Notes (Signed)
Notified CT that pt now has 20g IV.

## 2012-09-18 NOTE — ED Notes (Signed)
Patient transported to CT 

## 2012-09-19 NOTE — ED Provider Notes (Signed)
See my full note  Hilario Quarry, MD 09/19/12 1119

## 2013-02-13 ENCOUNTER — Encounter (HOSPITAL_COMMUNITY): Payer: Self-pay | Admitting: Emergency Medicine

## 2013-02-13 ENCOUNTER — Emergency Department (HOSPITAL_COMMUNITY)
Admission: EM | Admit: 2013-02-13 | Discharge: 2013-02-13 | Disposition: A | Discharge status: Home / Self Care | Payer: Self-pay | Attending: Emergency Medicine | Admitting: Emergency Medicine

## 2013-02-13 DIAGNOSIS — Z8719 Personal history of other diseases of the digestive system: Secondary | ICD-10-CM | POA: Insufficient documentation

## 2013-02-13 DIAGNOSIS — Z3202 Encounter for pregnancy test, result negative: Secondary | ICD-10-CM | POA: Insufficient documentation

## 2013-02-13 DIAGNOSIS — K219 Gastro-esophageal reflux disease without esophagitis: Secondary | ICD-10-CM | POA: Insufficient documentation

## 2013-02-13 LAB — COMPREHENSIVE METABOLIC PANEL
Albumin: 3.5 g/dL (ref 3.5–5.2)
BUN: 11 mg/dL (ref 6–23)
Calcium: 9 mg/dL (ref 8.4–10.5)
Creatinine, Ser: 0.66 mg/dL (ref 0.50–1.10)
GFR calc Af Amer: >90 mL/min (ref >90)
Glucose, Bld: 118 mg/dL — ABNORMAL HIGH (ref 70–99)
Potassium: 4.2 mEq/L (ref 3.5–5.1)
Total Protein: 7.2 g/dL (ref 6.0–8.3)

## 2013-02-13 LAB — POCT PREGNANCY, URINE: Preg Test, Ur: NEGATIVE

## 2013-02-13 LAB — CBC WITH DIFFERENTIAL/PLATELET
Basophils Relative: 2 % — ABNORMAL HIGH (ref 0–1)
Eosinophils Absolute: 0.2 10*3/uL (ref 0.0–0.7)
Eosinophils Relative: 3 % (ref 0–5)
Hemoglobin: 12 g/dL (ref 12.0–15.0)
Lymphs Abs: 1.8 10*3/uL (ref 0.7–4.0)
MCH: 27.8 pg (ref 26.0–34.0)
MCHC: 33.5 g/dL (ref 30.0–36.0)
MCV: 82.9 fL (ref 78.0–100.0)
Monocytes Relative: 7 % (ref 3–12)
Neutrophils Relative %: 60 % (ref 43–77)
RBC: 4.32 MIL/uL (ref 3.87–5.11)

## 2013-02-13 LAB — URINALYSIS, ROUTINE W REFLEX MICROSCOPIC
Bilirubin Urine: NEGATIVE
Nitrite: NEGATIVE
Specific Gravity, Urine: 1.027 (ref 1.005–1.030)
Urobilinogen, UA: 0.2 mg/dL (ref 0.0–1.0)

## 2013-02-13 LAB — LIPASE, BLOOD: Lipase: 36 U/L (ref 11–59)

## 2013-02-13 LAB — OCCULT BLOOD, POC DEVICE: Fecal Occult Bld: NEGATIVE

## 2013-02-13 MED ORDER — GI COCKTAIL ~~LOC~~
30.0000 mL | Freq: Once | ORAL | Status: AC
  Administered 2013-02-13: 30 mL via ORAL
  Filled 2013-02-13: qty 30

## 2013-02-13 MED ORDER — OMEPRAZOLE 20 MG PO CPDR: 20.0000 mg | DELAYED_RELEASE_CAPSULE | Freq: Every day | ORAL | Status: DC

## 2013-02-13 MED ORDER — MORPHINE SULFATE 2 MG/ML IJ SOLN
2.0000 mg | Freq: Once | INTRAMUSCULAR | Status: AC
  Administered 2013-02-13: 2 mg via INTRAVENOUS
  Filled 2013-02-13: qty 1

## 2013-02-13 NOTE — ED Notes (Signed)
C/o intermittent upper abd pain x 7 days, n/v x 4 days, last emesis 2 days ago. Denies diarrhea. Also noticed blood with wiping after BM 2 days ago. Denies urinary symptoms, fever. States pain worse after eating. Tried OTC "acid reflux medicine" with no change in pain

## 2013-02-13 NOTE — ED Provider Notes (Signed)
CSN: [id number]     Arrival date & time 02/13/13  1478 History     First MD Initiated Contact with Patient 02/13/13 [phone]     Chief Complaint  Patient presents with  . Abdominal Pain   (Consider location/radiation/quality/duration/timing/severity/associated sxs/prior Treatment) HPI Comments: Patient is a 42 year old woman with no significant past medical history whose last menstrual period was around June 20 presents to the emergency department today with approximately one week of intermittent, nonradiating, moderate epigastric pain. She states the pain is nonradiating. Eating makes the pain worse. Nothing makes the pain better. She denies having previous episodes such as this.  She states that she had a few episodes of nonbilious vomiting 2 days ago with one episode of blood-streaked vomitus 2 days ago. She also notes that she saw some blood on the toilet paper after his stool yesterday. She denies diarrhea or constipation. She denies fevers, but endorses a new dry cough for the past week.  She denies any other pain, injury, dysuria, or vaginal discharge.  Patient is a 42 y.o. female presenting with abdominal pain.  Abdominal Pain Associated symptoms include abdominal pain.    Past Medical History  Diagnosis Date  . Gallstones    Past Surgical History  Procedure Laterality Date  . Ectopic pregnancy surgery     No family history on file. History  Substance Use Topics  . Smoking status: Never Smoker   . Smokeless tobacco: Not on file  . Alcohol Use: No   OB History   Grav Para Term Preterm Abortions TAB SAB Ect Mult Living                 Review of Systems  Constitutional: Negative.   HENT: Negative.   Eyes: Negative.   Respiratory: Negative.   Cardiovascular: Negative.   Gastrointestinal: Positive for abdominal pain.       See history of present illness  Endocrine: Negative.   Genitourinary: Negative.   Musculoskeletal: Negative.   Skin: Negative.    Allergic/Immunologic: Negative.   Neurological: Negative.   Hematological: Negative.   Psychiatric/Behavioral: Negative.     Allergies  Codeine  Home Medications   Current Outpatient Rx  Name  Route  Sig  Dispense  Refill  . OVER THE COUNTER MEDICATION   Oral   Take 2 tablets by mouth daily as needed (acid reflex).          BP 113/73  Pulse 73  Temp(Src) 98.4 F (36.9 C)  SpO2 99%  LMP 01/15/2013 Physical Exam CONSTITUTIONAL  well developed, well nourished, alert, non toxic appearing; appears slightly uncomfortable. HEENT  normocephalic, atraumatic, external ears normal, nose normal, mildly dry mucous membranes EYES  normal conjunctiva, no sclera icterus noted, EOM intact, PERRL NECK  normal ROM, supple CARDIOVASCULAR  no signifcant murmur noted, full and effective pulses; regular rate and rhythm; no lower extremity edema, no calf Tenderness, 2+ pulses throughout PULMONARY/CHEST WALL  normal effort, no respiratory distress, BS normal, no wheezes, no rhonchi, no rales ABDOMINAL  normal external appearance, soft, nondistended, moderate epigastric and right upper quadrant tenderness to palpation. Normal active bowel sounds. GU  deferred MUSCULOSKELETAL full range of motion of all extremities.  NEUROLOGICAL  patient is awake and responsive;  no significant motor or sensory deficit noted  SKIN  warm, dry, intact, no rash PSYCHIATRIC  normal affect  ED Course   Procedures (including critical care time)  Labs Reviewed  URINALYSIS, ROUTINE W REFLEX MICROSCOPIC  CBC WITH DIFFERENTIAL  COMPREHENSIVE METABOLIC  PANEL  LIPASE, BLOOD  OCCULT BLOOD X 1 CARD TO LAB, STOOL   No results found. No diagnosis found.  MDM  Patient with no significant past medical history with right upper quadrant and epigastric pain that is intermittent for the past week and worse with food intake. Abdominal exam does not suggest need for emergent imaging. Labs are reassuring. I do not suspect GYN  issue. Likely related to esophagitis versus gastritis versus uncomplicated gallstones.  12:20 PM I reassessed the patient who is sitting in bed comfortably. No significant right upper quadrant tenderness on exam. Continues to have some mild epigastric discomfort on exam. We'll give GI cocktail and recommend follow up with PCP for further workup with strict return precautions.  Ashby Dawes, MD 02/13/13 [phone]

## 2013-02-13 NOTE — ED Notes (Signed)
Mid diffuse abd pain and x 7 days w/ blood in stool and vomit x 2 days no diarrhea she states

## 2015-05-10 ENCOUNTER — Encounter (HOSPITAL_COMMUNITY): Payer: Self-pay | Admitting: Emergency Medicine

## 2015-05-10 ENCOUNTER — Emergency Department (HOSPITAL_COMMUNITY): Payer: Self-pay

## 2015-05-10 ENCOUNTER — Observation Stay (HOSPITAL_COMMUNITY)
Admission: EM | Admit: 2015-05-10 | Discharge: 2015-05-14 | Disposition: A | Discharge status: Home / Self Care | Payer: Self-pay | Attending: Internal Medicine | Admitting: Internal Medicine

## 2015-05-10 DIAGNOSIS — Z6832 Body mass index (BMI) 32.0-32.9, adult: Secondary | ICD-10-CM | POA: Insufficient documentation

## 2015-05-10 DIAGNOSIS — R1084 Generalized abdominal pain: Secondary | ICD-10-CM

## 2015-05-10 DIAGNOSIS — R1110 Vomiting, unspecified: Secondary | ICD-10-CM | POA: Diagnosis present

## 2015-05-10 DIAGNOSIS — R112 Nausea with vomiting, unspecified: Secondary | ICD-10-CM | POA: Insufficient documentation

## 2015-05-10 DIAGNOSIS — E669 Obesity, unspecified: Secondary | ICD-10-CM | POA: Insufficient documentation

## 2015-05-10 DIAGNOSIS — K7581 Nonalcoholic steatohepatitis (NASH): Secondary | ICD-10-CM | POA: Insufficient documentation

## 2015-05-10 DIAGNOSIS — E876 Hypokalemia: Secondary | ICD-10-CM | POA: Insufficient documentation

## 2015-05-10 DIAGNOSIS — R1011 Right upper quadrant pain: Principal | ICD-10-CM | POA: Insufficient documentation

## 2015-05-10 DIAGNOSIS — E860 Dehydration: Secondary | ICD-10-CM | POA: Insufficient documentation

## 2015-05-10 DIAGNOSIS — K648 Other hemorrhoids: Secondary | ICD-10-CM | POA: Insufficient documentation

## 2015-05-10 DIAGNOSIS — R109 Unspecified abdominal pain: Secondary | ICD-10-CM

## 2015-05-10 DIAGNOSIS — K828 Other specified diseases of gallbladder: Secondary | ICD-10-CM

## 2015-05-10 DIAGNOSIS — R1013 Epigastric pain: Secondary | ICD-10-CM | POA: Insufficient documentation

## 2015-05-10 DIAGNOSIS — D649 Anemia, unspecified: Secondary | ICD-10-CM | POA: Insufficient documentation

## 2015-05-10 DIAGNOSIS — R52 Pain, unspecified: Secondary | ICD-10-CM

## 2015-05-10 LAB — URINALYSIS, ROUTINE W REFLEX MICROSCOPIC
Bilirubin Urine: NEGATIVE
Glucose, UA: NEGATIVE mg/dL
HGB URINE DIPSTICK: NEGATIVE
Ketones, ur: NEGATIVE mg/dL
LEUKOCYTES UA: NEGATIVE
NITRITE: NEGATIVE
PROTEIN: NEGATIVE mg/dL
SPECIFIC GRAVITY, URINE: 1.026 (ref 1.005–1.030)
UROBILINOGEN UA: 0.2 mg/dL (ref 0.0–1.0)
pH: 5.5 (ref 5.0–8.0)

## 2015-05-10 LAB — COMPREHENSIVE METABOLIC PANEL
ALBUMIN: 3.7 g/dL (ref 3.5–5.0)
ALK PHOS: 54 U/L (ref 38–126)
ALT: 12 U/L — ABNORMAL LOW (ref 14–54)
ANION GAP: 8 (ref 5–15)
AST: 16 U/L (ref 15–41)
BILIRUBIN TOTAL: 0.7 mg/dL (ref 0.3–1.2)
BUN: 7 mg/dL (ref 6–20)
CALCIUM: 9 mg/dL (ref 8.9–10.3)
CO2: 24 mmol/L (ref 22–32)
Chloride: 103 mmol/L (ref 101–111)
Creatinine, Ser: 0.66 mg/dL (ref 0.44–1.00)
GFR calc non Af Amer: >60 mL/min (ref >60)
GLUCOSE: 140 mg/dL — AB (ref 65–99)
POTASSIUM: 3.3 mmol/L — AB (ref 3.5–5.1)
SODIUM: 135 mmol/L (ref 135–145)
TOTAL PROTEIN: 7.2 g/dL (ref 6.5–8.1)

## 2015-05-10 LAB — CBC
HEMATOCRIT: 36.6 % (ref 36.0–46.0)
HEMOGLOBIN: 11.7 g/dL — AB (ref 12.0–15.0)
MCH: 26.6 pg (ref 26.0–34.0)
MCHC: 32 g/dL (ref 30.0–36.0)
MCV: 83.2 fL (ref 78.0–100.0)
Platelets: 316 10*3/uL (ref 150–400)
RBC: 4.4 MIL/uL (ref 3.87–5.11)
RDW: 14.3 % (ref 11.5–15.5)
WBC: 6.9 10*3/uL (ref 4.0–10.5)

## 2015-05-10 LAB — I-STAT BETA HCG BLOOD, ED (MC, WL, AP ONLY)

## 2015-05-10 LAB — LIPASE, BLOOD: Lipase: 28 U/L (ref 22–51)

## 2015-05-10 MED ORDER — HYDROMORPHONE HCL 1 MG/ML IJ SOLN
0.5000 mg | Freq: Once | INTRAMUSCULAR | Status: AC
  Administered 2015-05-10: 0.5 mg via INTRAVENOUS
  Filled 2015-05-10: qty 1

## 2015-05-10 MED ORDER — SODIUM CHLORIDE 0.9 % IV BOLUS (SEPSIS)
1000.0000 mL | Freq: Once | INTRAVENOUS | Status: AC
  Administered 2015-05-10: 1000 mL via INTRAVENOUS

## 2015-05-10 MED ORDER — HYDROMORPHONE HCL 1 MG/ML IJ SOLN
1.0000 mg | Freq: Once | INTRAMUSCULAR | Status: AC
  Administered 2015-05-10: 1 mg via INTRAVENOUS
  Filled 2015-05-10: qty 1

## 2015-05-10 MED ORDER — PANTOPRAZOLE SODIUM 40 MG IV SOLR
40.0000 mg | INTRAVENOUS | Status: DC
  Administered 2015-05-10 – 2015-05-13 (×4): 40 mg via INTRAVENOUS
  Filled 2015-05-10 (×4): qty 40

## 2015-05-10 MED ORDER — ONDANSETRON HCL 4 MG/2ML IJ SOLN
4.0000 mg | Freq: Once | INTRAMUSCULAR | Status: AC
  Administered 2015-05-10: 4 mg via INTRAVENOUS
  Filled 2015-05-10: qty 2

## 2015-05-10 MED ORDER — GI COCKTAIL ~~LOC~~
30.0000 mL | Freq: Once | ORAL | Status: AC
  Administered 2015-05-10: 30 mL via ORAL
  Filled 2015-05-10: qty 30

## 2015-05-10 MED ORDER — METOCLOPRAMIDE HCL 5 MG/ML IJ SOLN
10.0000 mg | Freq: Once | INTRAMUSCULAR | Status: AC
  Administered 2015-05-10: 10 mg via INTRAVENOUS
  Filled 2015-05-10: qty 2

## 2015-05-10 NOTE — ED Notes (Signed)
Patient transported to Ultrasound 

## 2015-05-10 NOTE — ED Notes (Signed)
Pt given ginger ale.

## 2015-05-10 NOTE — ED Notes (Signed)
Admitting at bedside 

## 2015-05-10 NOTE — ED Provider Notes (Signed)
CSN: [id number]     Arrival date & time 05/10/15  1710 History   First MD Initiated Contact with Patient 05/10/15 1825     Chief Complaint  Patient presents with  . Abdominal Pain     (Consider location/radiation/quality/duration/timing/severity/associated sxs/prior Treatment) HPI 44 year old female who presents with epigastric and right upper quadrant abdominal pain. History of laparoscopy for ectopic pregnancy. Reports that she has otherwise been in her usual state of health, but developed postprandial upper abdominal pain 4 days ago. Pain has been constant, but significantly worsened after eating. Over the course of the past 1-2 days has had nausea and vomiting after she eats. Denies fevers, chills, night sweats, flank pain, urinary complaints, or diarrhea. Denies any associating chest pain or difficulty breathing. Past Medical History  Diagnosis Date  . Gallstones    Past Surgical History  Procedure Laterality Date  . Ectopic pregnancy surgery     History reviewed. No pertinent family history. Social History  Substance Use Topics  . Smoking status: Never Smoker   . Smokeless tobacco: None  . Alcohol Use: No   OB History    No data available     Review of Systems 10/14 systems reviewed and are negative other than those stated in the HPI   Allergies  Codeine  Home Medications   Prior to Admission medications   Not on File   BP 109/66 mmHg  Pulse 82  Temp(Src) 98 F (36.7 C) (Oral)  Resp 16  Ht 5\' 6"  (1.676 m)  Wt 200 lb (90.719 kg)  BMI 32.30 kg/m2  SpO2 99% Physical Exam Physical Exam  Nursing note and vitals reviewed. Constitutional: Well developed, well nourished, non-toxic, and in no acute distress Head: Normocephalic and atraumatic.  Mouth/Throat: Oropharynx is clear and moist.  Neck: Normal range of motion. Neck supple.  Cardiovascular: Normal rate and regular rhythm. No edema.   Pulmonary/Chest: Effort normal and breath sounds normal.  Abdominal:  Soft. Obese. Not distended. Epigastric and RUQ tenderness to palpation. No tenderness at McBurney's point.  There is no rebound and no guarding.  Musculoskeletal: Normal range of motion.  Neurological: Alert, no facial droop, fluent speech, moves all extremities symmetrically Skin: Skin is warm and dry.  Psychiatric: Cooperative  ED Course  Procedures (including critical care time) Labs Review Labs Reviewed  COMPREHENSIVE METABOLIC PANEL - Abnormal; Notable for the following:    Potassium 3.3 (*)    Glucose, Bld 140 (*)    ALT 12 (*)    All other components within normal limits  CBC - Abnormal; Notable for the following:    Hemoglobin 11.7 (*)    All other components within normal limits  LIPASE, BLOOD  URINALYSIS, ROUTINE W REFLEX MICROSCOPIC (NOT [facility])  I-STAT BETA HCG BLOOD, ED ([facility], [facility], [facility] ONLY)    Imaging Review [place] Abdomen Limited Ruq  05/10/2015  CLINICAL DATA:  Right upper quadrant abdominal pain with nausea and vomiting 3 days. EXAM: US ABDOMEN LIMITED - RIGHT UPPER QUADRANT COMPARISON:  06/10/2012 CT abdomen/pelvis FINDINGS: Gallbladder: Slightly contracted gallbladder. No gallbladder wall thickening. No gallstones. No pericholecystic fluid. No sonographic Murphy sign. Common bile duct: Diameter: 4 mm Liver: Liver parenchyma is diffusely mildly heterogeneously echogenic with mild posterior acoustic attenuation, suggesting mild diffuse hepatic steatosis. No liver mass is detected, noting decreased sensitivity in the setting of an echogenic liver. Patent main portal vein with appropriate flow direction. IMPRESSION: 1. No cholelithiasis.  No evidence of acute cholecystitis. 2. Slightly contracted gallbladder. Given that  the patient is in a fasting state, this is an abnormal finding and suggests biliary dyskinesia, which could be further evaluated on an outpatient basis with hepatobiliary scintigraphy with CCK as clinically warranted. 3. No biliary ductal dilatation. 4. Mild diffuse  hepatic steatosis. Electronically Signed   By: Delbert Phenix M.D.   On: 05/10/2015 19:28   I have personally reviewed and evaluated these images and lab results as part of my medical decision-making.   EKG Interpretation None      MDM   Final diagnoses:  RUQ abdominal pain  Intractable vomiting with nausea, vomiting of unspecified type  Biliary dyskinesia    44 year old female with history of laparoscopy for ectopic pregnancy  who presents with postprandial right upper quadrant and epigastric abdominal pain associated with nausea and vomiting. She is nontoxic and in no acute distress. Vital signs are not concerning. She has a soft and nonsurgical abdomen, with focal epigastric and right upper quadrant tenderness to palpation. Concern for persistent biliary colic versus cholecystitis. Basic blood work including CBC, comp metabolic panel, lipase, pregnancy test, and UA are unremarkable.   Right upper quadrant ultrasound does not show evidence of cholecystitis or cholelithiasis. There are some findings that may suggest gallbladder dyskinesia, which can cause pain similar to biliary colic. Patient is given supportive care with IV fluids, analgesics, and anti-emetics. Continues to have nausea and vomiting despite repeat antiemetics. Persistent abdominal pain as well. Will admit for symptomatic control.     Lavera Guise, MD 05/10/15 [phone]

## 2015-05-10 NOTE — ED Notes (Signed)
Pt c/o RUQ pain x 3 days with N/V

## 2015-05-11 ENCOUNTER — Observation Stay (HOSPITAL_COMMUNITY): Payer: Self-pay

## 2015-05-11 ENCOUNTER — Encounter (HOSPITAL_COMMUNITY): Payer: Self-pay | Admitting: Rehabilitation

## 2015-05-11 DIAGNOSIS — E860 Dehydration: Secondary | ICD-10-CM | POA: Diagnosis present

## 2015-05-11 DIAGNOSIS — E876 Hypokalemia: Secondary | ICD-10-CM | POA: Diagnosis present

## 2015-05-11 DIAGNOSIS — R1011 Right upper quadrant pain: Secondary | ICD-10-CM

## 2015-05-11 DIAGNOSIS — R112 Nausea with vomiting, unspecified: Secondary | ICD-10-CM

## 2015-05-11 DIAGNOSIS — K7581 Nonalcoholic steatohepatitis (NASH): Secondary | ICD-10-CM | POA: Diagnosis present

## 2015-05-11 DIAGNOSIS — R1110 Vomiting, unspecified: Secondary | ICD-10-CM | POA: Insufficient documentation

## 2015-05-11 DIAGNOSIS — R109 Unspecified abdominal pain: Secondary | ICD-10-CM | POA: Diagnosis present

## 2015-05-11 LAB — COMPREHENSIVE METABOLIC PANEL
ALK PHOS: 45 U/L (ref 38–126)
ALT: 9 U/L — AB (ref 14–54)
AST: 16 U/L (ref 15–41)
Albumin: 2.8 g/dL — ABNORMAL LOW (ref 3.5–5.0)
Anion gap: 6 (ref 5–15)
BUN: 7 mg/dL (ref 6–20)
CALCIUM: 7.8 mg/dL — AB (ref 8.9–10.3)
CO2: 24 mmol/L (ref 22–32)
CREATININE: 0.61 mg/dL (ref 0.44–1.00)
Chloride: 105 mmol/L (ref 101–111)
Glucose, Bld: 133 mg/dL — ABNORMAL HIGH (ref 65–99)
Potassium: 3.9 mmol/L (ref 3.5–5.1)
Sodium: 135 mmol/L (ref 135–145)
Total Bilirubin: 0.6 mg/dL (ref 0.3–1.2)
Total Protein: 5.9 g/dL — ABNORMAL LOW (ref 6.5–8.1)

## 2015-05-11 LAB — CBC WITH DIFFERENTIAL/PLATELET
BASOS PCT: 0 %
Basophils Absolute: 0 10*3/uL (ref 0.0–0.1)
EOS ABS: 0 10*3/uL (ref 0.0–0.7)
EOS PCT: 0 %
HCT: 31.5 % — ABNORMAL LOW (ref 36.0–46.0)
HEMOGLOBIN: 10 g/dL — AB (ref 12.0–15.0)
Lymphocytes Relative: 17 %
Lymphs Abs: 1.2 10*3/uL (ref 0.7–4.0)
MCH: 26.7 pg (ref 26.0–34.0)
MCHC: 31.7 g/dL (ref 30.0–36.0)
MCV: 84.2 fL (ref 78.0–100.0)
MONOS PCT: 4 %
Monocytes Absolute: 0.3 10*3/uL (ref 0.1–1.0)
NEUTROS PCT: 79 %
Neutro Abs: 5.5 10*3/uL (ref 1.7–7.7)
PLATELETS: 276 10*3/uL (ref 150–400)
RBC: 3.74 MIL/uL — AB (ref 3.87–5.11)
RDW: 14.5 % (ref 11.5–15.5)
WBC: 7.1 10*3/uL (ref 4.0–10.5)

## 2015-05-11 LAB — PREGNANCY, URINE: PREG TEST UR: NEGATIVE

## 2015-05-11 LAB — TROPONIN I: Troponin I: <0.03 ng/mL (ref <0.031)

## 2015-05-11 MED ORDER — GI COCKTAIL ~~LOC~~
30.0000 mL | Freq: Once | ORAL | Status: AC
  Administered 2015-05-11: 30 mL via ORAL
  Filled 2015-05-11: qty 30

## 2015-05-11 MED ORDER — HYDROMORPHONE HCL 1 MG/ML IJ SOLN
0.5000 mg | INTRAMUSCULAR | Status: DC | PRN
Start: 2015-05-11 — End: 2015-05-14
  Administered 2015-05-11 – 2015-05-14 (×3): 0.5 mg via INTRAVENOUS
  Filled 2015-05-11 (×3): qty 1

## 2015-05-11 MED ORDER — IOHEXOL 300 MG/ML  SOLN
100.0000 mL | Freq: Once | INTRAMUSCULAR | Status: AC | PRN
  Administered 2015-05-11: 100 mL via INTRAVENOUS

## 2015-05-11 MED ORDER — ONDANSETRON HCL 4 MG/2ML IJ SOLN
4.0000 mg | Freq: Four times a day (QID) | INTRAMUSCULAR | Status: DC | PRN
  Administered 2015-05-11 – 2015-05-13 (×3): 4 mg via INTRAVENOUS
  Filled 2015-05-11 (×3): qty 2

## 2015-05-11 MED ORDER — POTASSIUM CHLORIDE IN NACL 20-0.9 MEQ/L-% IV SOLN
INTRAVENOUS | Status: DC
  Administered 2015-05-11 – 2015-05-13 (×3): via INTRAVENOUS
  Filled 2015-05-11 (×8): qty 1000

## 2015-05-11 MED ORDER — SINCALIDE 5 MCG IJ SOLR
INTRAMUSCULAR | Status: AC
  Filled 2015-05-11: qty 5

## 2015-05-11 MED ORDER — ASPIRIN 300 MG RE SUPP
300.0000 mg | Freq: Once | RECTAL | Status: AC
  Administered 2015-05-11: 300 mg via RECTAL
  Filled 2015-05-11: qty 1

## 2015-05-11 MED ORDER — HYDROMORPHONE HCL 1 MG/ML IJ SOLN: 0.5000 mg | INTRAMUSCULAR | Status: DC | PRN

## 2015-05-11 MED ORDER — TECHNETIUM TC 99M MEBROFENIN IV KIT
5.3000 | PACK | Freq: Once | INTRAVENOUS | Status: DC | PRN
  Administered 2015-05-11: 5 via INTRAVENOUS
  Filled 2015-05-11: qty 6

## 2015-05-11 MED ORDER — ONDANSETRON HCL 4 MG PO TABS: 4.0000 mg | ORAL_TABLET | Freq: Four times a day (QID) | ORAL | Status: DC | PRN

## 2015-05-11 MED ORDER — SINCALIDE 5 MCG IJ SOLR
0.0200 ug/kg | Freq: Once | INTRAMUSCULAR | Status: AC
  Administered 2015-05-11: 1.8 ug via INTRAVENOUS
  Filled 2015-05-11: qty 5

## 2015-05-11 MED ORDER — SODIUM CHLORIDE 0.9 % IJ SOLN
3.0000 mL | Freq: Two times a day (BID) | INTRAMUSCULAR | Status: DC
  Administered 2015-05-13 – 2015-05-14 (×3): 3 mL via INTRAVENOUS

## 2015-05-11 MED ORDER — ACETAMINOPHEN 325 MG PO TABS: 650.0000 mg | ORAL_TABLET | Freq: Four times a day (QID) | ORAL | Status: DC | PRN

## 2015-05-11 NOTE — H&P (Signed)
[facility] History and Physical  Angela Williams ZOX:[id number] DOB: Jan 21, 1971 DOA: 05/10/2015  Referring physician: Lavera Guise, MD PCP: No PCP Per Patient   Chief Complaint: Abdominal pain.  HPI: Angela Williams is a 44 y.o. female with past medical history of ectopic pregnancy and gallstones who comes to the emergency department due to right upper quadrant and epigastric abdominal pain since Saturday morning. The pain is worsened by food intake, but the patient does not remember the initial episode being triggered by food intake. She has had several episodes of nausea and emesis, particularly after she eats. She denies melena, hematochezia, constipation, but states that she has had one episode of mildly loose stools today. She says that she was told that she had gallstones in the past. She denies chest pain, palpitations, dyspnea, diaphoresis, pitting edema all the lower extremities. She denies dysuria or frequency.  When seen in the emergency department, the patient was in no acute distress, but still having pain and some nausea even though she was medicated previously with Tylenol with 1 mg IV push twice, a milligrams of Zofran and Reglan IV.  Review of Systems:  Constitutional:  Positive fatigue. No weight loss, night sweats, Fevers, chills.   HEENT:  No headaches, Difficulty swallowing,Tooth/dental problems,Sore throat,  No sneezing, itching, ear ache, nasal congestion, post nasal drip,  Cardio-vascular:  No chest pain, Orthopnea, PND, swelling in lower extremities, anasarca, dizziness, palpitations  GI:  Positive abdominal pain, nausea, vomiting, diarrhea. No heartburn, indigestion,  change in bowel habits, loss of appetite  Resp:  No shortness of breath with exertion or at rest. No excess mucus, no productive cough, No non-productive cough, No coughing up of blood.No change in color of mucus.No wheezing.No chest wall deformity  Skin:  no rash or lesions.  GU:  no dysuria,  change in color of urine, no urgency or frequency. No flank pain.  Musculoskeletal:  No joint pain or swelling. No decreased range of motion. No back pain.  Psych:  No change in mood or affect. No depression or anxiety. No memory loss.   Past Medical History  Diagnosis Date  . Gallstones    Past Surgical History  Procedure Laterality Date  . Ectopic pregnancy surgery     Social History:  reports that she has never smoked. She does not have any smokeless tobacco history on file. She reports that she does not drink alcohol or use illicit drugs.  Allergies  Allergen Reactions  . Codeine Other (See Comments)    Will not wake up.    Family History  Problem Relation Age of Onset  . Pulmonary embolism Mother   . Diabetes Mellitus II Mother   . Hypertension Father   . CVA Father     Prior to Admission medications   Not on File   Physical Exam: Filed Vitals:   05/11/15 0000 05/11/15 0015 05/11/15 0030 05/11/15 0104  BP: 123/70 111/64 110/59 115/54  Pulse: 87 80 80 73  Temp:    98.2 F (36.8 C)  TempSrc:    Oral  Resp: Height:      Weight:      SpO2: 97% 94% 94% 92%    Wt Readings from Last 3 Encounters:  05/10/15 90.719 kg (200 lb)    General:  Appears calm and comfortable Eyes: PERRL, normal lids, irises & conjunctiva ENT: grossly normal hearing, lips & tongue Neck: no LAD, masses or thyromegaly Cardiovascular: RRR, no m/r/g. No LE edema.  Telemetry: SR, no arrhythmias  Respiratory: CTA bilaterally, no w/r/r. Normal respiratory effort. Abdomen: Lower midline surgical scar, Bowel sounds positive, soft, positive right upper quadrant and epigastric tenderness, no guarding, no rebound tenderness, no organomegaly was palpated. Skin: no rash or induration seen on limited exam Musculoskeletal: grossly normal tone BUE/BLE Psychiatric: grossly normal mood and affect, speech fluent and appropriate Neurologic: grossly non-focal.          Labs on Admission:    Basic Metabolic Panel:  Recent Labs Lab 05/10/15 1742  NA 135  K 3.3*  CL 103  CO2 24  GLUCOSE 140*  BUN 7  CREATININE 0.66  CALCIUM 9.0   Liver Function Tests:  Recent Labs Lab 05/10/15 1742  AST 16  ALT 12*  ALKPHOS 54  BILITOT 0.7  PROT 7.2  ALBUMIN 3.7    Recent Labs Lab 05/10/15 1742  LIPASE 28   CBC:  Recent Labs Lab 05/10/15 1742  WBC 6.9  HGB 11.7*  HCT 36.6  MCV 83.2  PLT 316    Radiological Exams on Admission: Koreas Abdomen Limited Ruq  05/10/2015  CLINICAL DATA:  Right upper quadrant abdominal pain with nausea and vomiting 3 days. EXAM: US ABDOMEN LIMITED - RIGHT UPPER QUADRANT COMPARISON:  06/10/2012 CT abdomen/pelvis FINDINGS: Gallbladder: Slightly contracted gallbladder. No gallbladder wall thickening. No gallstones. No pericholecystic fluid. No sonographic Murphy sign. Common bile duct: Diameter: 4 mm Liver: Liver parenchyma is diffusely mildly heterogeneously echogenic with mild posterior acoustic attenuation, suggesting mild diffuse hepatic steatosis. No liver mass is detected, noting decreased sensitivity in the setting of an echogenic liver. Patent main portal vein with appropriate flow direction. IMPRESSION: 1. No cholelithiasis.  No evidence of acute cholecystitis. 2. Slightly contracted gallbladder. Given that the patient is in a fasting state, this is an abnormal finding and suggests biliary dyskinesia, which could be further evaluated on an outpatient basis with hepatobiliary scintigraphy with CCK as clinically warranted. 3. No biliary ductal dilatation. 4. Mild diffuse hepatic steatosis. Electronically Signed   By: Delbert PhenixJason A Poff M.D.   On: 05/10/2015 19:28     EKG: Independently reviewed.  Vent. rate 82 BPM PR interval 192 ms QRS duration 105 ms QT/QTc 434/507 ms P-R-T axes 63 63 43 Sinus rhythm Borderline T abnormalities, anterior leads Borderline prolonged QT interval  Assessment/Plan Principal Problem:   Intractable  vomiting Admit to Med-Surg for symptoms treatment. Continue antiemetic as needed. Continue analgesics as needed. Continue IV hydration with potassium supplementation. Hepatobiliary scintigraphy with CCK as an outpatient.  Active Problems:   Hypokalemia   Dehydration   Abdominal pain As above mentioned.   Code Status: Full code. DVT Prophylaxis: SCDs. Family Communication:  Disposition Plan: Mid to observation for symptoms management.  Time spent: Over 70 minutes were spent during the process of this admission.   Bobette Moavid Manuel Shyanna Klingel [facility] Pager [phone].

## 2015-05-11 NOTE — Progress Notes (Signed)
1949 Patient complaining of left breast pain hurts worse with deep breath and abdominal pain and shortness of breath.Patient stated had similar pain earlier today that went away on its own.VS Temp 98.4 HR 79 RR 20 B/P 122/74.Oxygen at 2 liters nasal canula placed on patient oxygen level 100%.Text paged Maren ReamerKaren Kirby NP.

## 2015-05-11 NOTE — Care Management Note (Signed)
Case Management Note  Patient Details  Name: Angela Williams MRN: [id number] Date of Birth: [birth year]  Subjective/Objective:                 Patient placed in observation with intractable nausea and vomiting. Patient independent from home with spouse. Patient does not have insurance or PCP.   Action/Plan:  [facility], entered in AV[facility]. Patient may use their pharmacy for Rx at discharge to be filled at low cost.   Expected Discharge Date:                  Expected Discharge Plan:  Home/Self Care  In-House Referral:     Discharge planning Services  CM Consult  Post Acute Care Choice:    Choice offered to:     DME Arranged:    DME Agency:     HH Arranged:    HH Agency:     Status of Service:  In process, will continue to follow  Medicare Important Message Given:    Date Medicare IM Given:    Medicare IM give by:    Date Additional Medicare IM Given:    Additional Medicare Important Message give by:     If discussed at Long Length of Stay Meetings, dates discussed:    Additional Comments:  Lawerance SabalDebbie Celeste Candelas, RN 05/11/2015, 3:44 PM

## 2015-05-11 NOTE — Progress Notes (Signed)
RN, Thurston HoleAnne, paged this NP earlier in shift secondary to pt's c/o left breast pain. + Sharp pain 6/10 and is exacerbated by deep breathing. No radiation of pain. Also, abd pain but this is not new. + nausea. No vomiting GI just saw pt in consult.  VSS. +chest pain-likely GI, doubt cardiac. EKG without acute changes. Check troponins as precaution.  ASA suppository given-did not see contraindication on chart. GI cocktail now. Zofran and Dilaudid.  RN to let me know if not resolved.  +++later, RN paged and said chest pain was gone.  Will follow. Jimmye NormanKaren Kirby-Graham, NP [facility]

## 2015-05-11 NOTE — Consult Note (Signed)
Referring Provider: Dr. Waymon AmatoHongalgi Primary Care Physician:  No PCP Per Patient Primary Gastroenterologist:  Gentry FitzUnassigned  Reason for Consultation:  Abdominal pain; Nausea and vomiting  HPI: Angela Williams is a 44 y.o. female who had the acute onset of 10/10 RUQ and sharp, epigastric abdominal pain this past Sunday. The pain was associated with intractable nausea and vomiting. Vomiting was nonbloody. Abdominal pain worse after eating. The pain felt like a "knot" that relieved itself earlier this week and then yesterday it returned and was severe and persistent and she came in for further evaluation. Denies melena or hematochezia. Moving formed bowels daily until today when she states that she has not had any BMs. Denies alcohol. Denies history of pancreatitis, ulcers, or gallstones. Denies NSAIDs. RUQ U/S showed no gallstones or acute cholecystitis but did show a "slightly contracted gallbladder" despite being fasting. A HIDA scan was normal. Abdominal pain has continued but not as bad as yesterday. Currently feeling left substernal chest pain that started earlier today and has improved and feels a little short of breath. Denies any chest pain at home. Lipase and LFTs normal.   History reviewed. No pertinent past medical history.  Past Surgical History  Procedure Laterality Date  . Ectopic pregnancy surgery      Prior to Admission medications   Not on File    Scheduled Meds: . pantoprazole (PROTONIX) IV  40 mg Intravenous Q24H  . sincalide      . sodium chloride  3 mL Intravenous Q12H   Continuous Infusions: . 0.9 % NaCl with KCl 20 mEq / L 50 mL/hr at 05/11/15 1450   PRN Meds:.acetaminophen, HYDROmorphone (DILAUDID) injection, ondansetron **OR** ondansetron (ZOFRAN) IV, technetium TC 89M mebrofenin  Allergies as of 05/10/2015 - Review Complete 05/10/2015  Allergen Reaction Noted  . Codeine Other (See Comments) 06/04/2011    Family History  Problem Relation Age of Onset  . Pulmonary  embolism Mother   . Diabetes Mellitus II Mother   . Hypertension Father   . CVA Father     Social History   Social History  . Marital Status: Married    Spouse Name: N/A  . Number of Children: N/A  . Years of Education: N/A   Occupational History  . Not on file.   Social History Main Topics  . Smoking status: Never Smoker   . Smokeless tobacco: Not on file  . Alcohol Use: No  . Drug Use: No  . Sexual Activity: Not on file   Other Topics Concern  . Not on file   Social History Narrative    Review of Systems: All negative except as stated above in HPI.  Physical Exam: Vital signs: Filed Vitals:   05/11/15 1429  BP: 112/80  Pulse: 86  Temp: 98.2 F (36.8 C)  Resp: 16     General:   Alert,  Well-developed, well-nourished, pleasant and cooperative in NAD Head: atraumatic Eyes: anicteric ENT: oropharynx clear, mouth, nose normal Neck: supple, nontender Lungs:  Clear throughout to auscultation.   No wheezes, crackles, or rhonchi. No acute distress. Heart:  Regular rate and rhythm; no murmurs, clicks, rubs,  or gallops. Abdomen: RUQ and epigastric tenderness with guarding, soft, nondistended, +BS Rectal:  Deferred Ext: pedal pulses intact, no edema Skin: no rash  GI:  Lab Results:  Recent Labs  05/10/15 1742 05/11/15 0439  WBC 6.9 7.1  HGB 11.7* 10.0*  HCT 36.6 31.5*  PLT 316 276   BMET  Recent Labs  05/10/15 1742 05/11/15 0439  NA 135 135  K 3.3* 3.9  CL 103 105  CO2 24 24  GLUCOSE 140* 133*  BUN 7 7  CREATININE 0.66 0.61  CALCIUM 9.0 7.8*   LFT  Recent Labs  05/11/15 0439  PROT 5.9*  ALBUMIN 2.8*  AST 16  ALT 9*  ALKPHOS 45  BILITOT 0.6   PT/INR No results for input(s): LABPROT, INR in the last 72 hours.   Studies/Results: Nm Hepatobiliary Liver Func  05/11/2015  CLINICAL DATA:  Nausea and vomiting.  Abdominal pain. EXAM: NUCLEAR MEDICINE HEPATOBILIARY IMAGING WITH GALLBLADDER EF TECHNIQUE: Sequential images of the abdomen  were obtained out to 60 minutes following intravenous administration of radiopharmaceutical. After slow intravenous infusion of micrograms Cholecystokinin, gallbladder ejection fraction was determined. RADIOPHARMACEUTICALS:  mCi Tc-71m Choletec IV COMPARISON:  Ultrasound 05/10/2015 FINDINGS: Liver, biliary system, gallbladder, bowel visualize normally. Gallbladder ejection fraction at 45 min is 91%. At 45 min, normal ejection fraction is greater than 40%. IMPRESSION: Normal exam. Electronically Signed   By: Maisie Fus  Register   On: 05/11/2015 13:38   US Abdomen Limited Ruq  05/10/2015  CLINICAL DATA:  Right upper quadrant abdominal pain with nausea and vomiting 3 days. EXAM: US ABDOMEN LIMITED - RIGHT UPPER QUADRANT COMPARISON:  06/10/2012 CT abdomen/pelvis FINDINGS: Gallbladder: Slightly contracted gallbladder. No gallbladder wall thickening. No gallstones. No pericholecystic fluid. No sonographic Murphy sign. Common bile duct: Diameter: 4 mm Liver: Liver parenchyma is diffusely mildly heterogeneously echogenic with mild posterior acoustic attenuation, suggesting mild diffuse hepatic steatosis. No liver mass is detected, noting decreased sensitivity in the setting of an echogenic liver. Patent main portal vein with appropriate flow direction. IMPRESSION: 1. No cholelithiasis.  No evidence of acute cholecystitis. 2. Slightly contracted gallbladder. Given that the patient is in a fasting state, this is an abnormal finding and suggests biliary dyskinesia, which could be further evaluated on an outpatient basis with hepatobiliary scintigraphy with CCK as clinically warranted. 3. No biliary ductal dilatation. 4. Mild diffuse hepatic steatosis. Electronically Signed   By: Delbert Phenix M.D.   On: 05/10/2015 19:28    Impression/Plan: 44 yo with severe RUQ and epigastric abdominal pain and intractable nausea and vomiting. My main concern is for a small bowel obstruction or partial SBO and she needs an abdominal CT to  further evaluate. Doubt peptic ulcer disease. If a CT is negative for SBO or pancreatitis (less likely with normal lipase), then will do an EGD tomorrow. Changed to NPO. Abd/pelvic CT with contrast. Informed nurse of chest pain and shortness of breath and advised her to call the hospitalist to evaluate.      Crit Obremski C.  05/11/2015, 7:41 PM  Pager [phone]  If no answer or after 5 PM call [phone]

## 2015-05-11 NOTE — Progress Notes (Signed)
PROGRESS NOTE    Angela Williams ZOX:[id number] DOB: 1970/10/23 DOA: 05/10/2015 PCP: No PCP Per Patient  HPI/Brief narrative 44 year old female patient with history of ectopic pregnancy and gallstones presented to [facility] on 05/10/15 with complaints of epigastric and RUQ abdominal pain. She states that she has been having intermittent abdominal pain for the last month which got worse on Sunday (2 days prior to admission) associated with several episodes of nonbloody emesis but no diarrhea. Subjective fevers without chills. She claimed abdominal pain worsened with food intake. She denies NSAID use. No hematemesis or melena reported. Since admission, RUQ ultrasound showed no evidence of cholelithiasis or acute cholecystitis and HIDA scan was normal. General surgery was consulted and do not see any surgical needs and recommend outpatient follow-up. GI consulted to consider EGD to rule out upper GI pathology as cause of her chronic symptoms.   Assessment/Plan:  Abdominal pain with non-intractable nausea and vomiting - Etiology unclear. LFTs and lipase unremarkable. RUQ ultrasound: No cholelithiasis. No evidence of acute cholecystitis fatty liver. HIDA scan: Normal - DD: Gastritis, esophagitis, ulcer disease versus other etiologies - Gen. surgery consulted and did not see any surgical needs and recommend outpatient follow-up - We'll consult GI to see if EGD is warranted to rule out above etiologies - Urine pregnancy test. - Urine microscopy: Not suggestive of UTI.  Hypokalemia - Replaced  Anemia - Likely chronic. Hemoglobin has dropped from 11.7 > 10 g per DL. Follow CBC in a.m.    DVT prophylaxis: SCDs Code Status: Full Family Communication: None at bedside Disposition Plan: DC home when medically stable   Consultants:  General surgery  [facility]  Procedures:  None  Antibiotics:  None   Subjective: Continued to complain of epigastric and RUQ abdominal pain. No further nausea  or vomiting since admission.  Objective: Filed Vitals:   05/11/15 0030 05/11/15 0104 05/11/15 0521 05/11/15 1429  BP: 110/59 115/54 111/58 112/80  Pulse: 80 73 87 86  Temp:  98.2 F (36.8 C) 98.2 F (36.8 C) 98.2 F (36.8 C)  TempSrc:  Oral Oral Oral  Resp: Height:      Weight:      SpO2: 94% 92% 96% 98%    Intake/Output Summary (Last 24 hours) at 05/11/15 1437 Last data filed at 05/11/15 1418  Gross per 24 hour  Intake      0 ml  Output      0 ml  Net      0 ml   Filed Weights   05/10/15 1715  Weight: 90.719 kg (200 lb)     Exam:  General exam: Pleasant young female lying comfortably supine in bed Respiratory system: Clear. No increased work of breathing. Cardiovascular system: S1 & S2 heard, RRR. No JVD, murmurs, gallops, clicks or pedal edema. Gastrointestinal system: Abdomen is nondistended, soft. Tender in epigastric and RUQ with some voluntary guarding but no rigidity, rebound. Normal bowel sounds heard. Central nervous system: Alert and oriented. No focal neurological deficits. Extremities: Symmetric 5 x 5 power.   Data Reviewed: Basic Metabolic Panel:  Recent Labs Lab 05/10/15 1742 05/11/15 0439  NA 135 135  K 3.3* 3.9  CL 103 105  CO2 24 24  GLUCOSE 140* 133*  BUN 7 7  CREATININE 0.66 0.61  CALCIUM 9.0 7.8*   Liver Function Tests:  Recent Labs Lab 05/10/15 1742 05/11/15 0439  AST 16 16  ALT 12* 9*  ALKPHOS 54 45  BILITOT 0.7 0.6  PROT 7.2 5.9*  ALBUMIN 3.7 2.8*    Recent Labs Lab 05/10/15 1742  LIPASE 28   No results for input(s): AMMONIA in the last 168 hours. CBC:  Recent Labs Lab 05/10/15 1742 05/11/15 0439  WBC 6.9 7.1  NEUTROABS  --  5.5  HGB 11.7* 10.0*  HCT 36.6 31.5*  MCV 83.2 84.2  PLT 316 276   Cardiac Enzymes: No results for input(s): CKTOTAL, CKMB, CKMBINDEX, TROPONINI in the last 168 hours. BNP (last 3 results) No results for input(s): PROBNP in the last 8760 hours. CBG: No results for  input(s): GLUCAP in the last 168 hours.  No results found for this or any previous visit (from the past 240 hour(s)).         Studies: Nm Hepatobiliary Liver Func  05/11/2015  CLINICAL DATA:  Nausea and vomiting.  Abdominal pain. EXAM: NUCLEAR MEDICINE HEPATOBILIARY IMAGING WITH GALLBLADDER EF TECHNIQUE: Sequential images of the abdomen were obtained out to 60 minutes following intravenous administration of radiopharmaceutical. After slow intravenous infusion of micrograms Cholecystokinin, gallbladder ejection fraction was determined. RADIOPHARMACEUTICALS:  mCi Tc-2529m Choletec IV COMPARISON:  Ultrasound 05/10/2015 FINDINGS: Liver, biliary system, gallbladder, bowel visualize normally. Gallbladder ejection fraction at 45 min is 91%. At 45 min, normal ejection fraction is greater than 40%. IMPRESSION: Normal exam. Electronically Signed   By: Maisie Fushomas  Register   On: 05/11/2015 13:38   [place] Abdomen Limited Ruq  05/10/2015  CLINICAL DATA:  Right upper quadrant abdominal pain with nausea and vomiting 3 days. EXAM: US ABDOMEN LIMITED - RIGHT UPPER QUADRANT COMPARISON:  06/10/2012 CT abdomen/pelvis FINDINGS: Gallbladder: Slightly contracted gallbladder. No gallbladder wall thickening. No gallstones. No pericholecystic fluid. No sonographic Murphy sign. Common bile duct: Diameter: 4 mm Liver: Liver parenchyma is diffusely mildly heterogeneously echogenic with mild posterior acoustic attenuation, suggesting mild diffuse hepatic steatosis. No liver mass is detected, noting decreased sensitivity in the setting of an echogenic liver. Patent main portal vein with appropriate flow direction. IMPRESSION: 1. No cholelithiasis.  No evidence of acute cholecystitis. 2. Slightly contracted gallbladder. Given that the patient is in a fasting state, this is an abnormal finding and suggests biliary dyskinesia, which could be further evaluated on an outpatient basis with hepatobiliary scintigraphy with CCK as clinically  warranted. 3. No biliary ductal dilatation. 4. Mild diffuse hepatic steatosis. Electronically Signed   By: Delbert PhenixJason A Poff M.D.   On: 05/10/2015 19:28        Scheduled Meds: . pantoprazole (PROTONIX) IV  40 mg Intravenous Q24H  . sincalide      . sodium chloride  3 mL Intravenous Q12H   Continuous Infusions: . 0.9 % NaCl with KCl 20 mEq / L 125 mL/hr at 05/11/15 0227    Principal Problem:   Intractable vomiting Active Problems:   Hypokalemia   Dehydration   Abdominal pain   NASH (nonalcoholic steatohepatitis)    Time spent: 40 minutes.    Marcellus ScottHONGALGI,Fardeen Steinberger, MD, FACP, FHM. [facility] Pager [phone]  If 7PM-7AM, please contact night-coverage [email or web address] Password [email or web address] 05/11/2015, 2:37 PM

## 2015-05-12 ENCOUNTER — Encounter (HOSPITAL_COMMUNITY)
Admission: EM | Disposition: A | Discharge status: Home / Self Care | Payer: Self-pay | Source: Home / Self Care | Attending: Emergency Medicine

## 2015-05-12 ENCOUNTER — Encounter (HOSPITAL_COMMUNITY): Payer: Self-pay | Admitting: *Deleted

## 2015-05-12 HISTORY — PX: ESOPHAGOGASTRODUODENOSCOPY: SHX5428

## 2015-05-12 LAB — CBC
HEMATOCRIT: 31.3 % — AB (ref 36.0–46.0)
Hemoglobin: 9.9 g/dL — ABNORMAL LOW (ref 12.0–15.0)
MCH: 26.7 pg (ref 26.0–34.0)
MCHC: 31.6 g/dL (ref 30.0–36.0)
MCV: 84.4 fL (ref 78.0–100.0)
Platelets: 266 10*3/uL (ref 150–400)
RBC: 3.71 MIL/uL — ABNORMAL LOW (ref 3.87–5.11)
RDW: 14.5 % (ref 11.5–15.5)
WBC: 5.6 10*3/uL (ref 4.0–10.5)

## 2015-05-12 SURGERY — EGD (ESOPHAGOGASTRODUODENOSCOPY)
Anesthesia: Moderate Sedation

## 2015-05-12 MED ORDER — DIPHENHYDRAMINE HCL 50 MG/ML IJ SOLN
INTRAMUSCULAR | Status: AC
  Filled 2015-05-12: qty 1

## 2015-05-12 MED ORDER — FENTANYL CITRATE (PF) 100 MCG/2ML IJ SOLN
INTRAMUSCULAR | Status: DC | PRN
  Administered 2015-05-12 (×2): 25 ug via INTRAVENOUS

## 2015-05-12 MED ORDER — MIDAZOLAM HCL 5 MG/ML IJ SOLN
INTRAMUSCULAR | Status: AC
  Filled 2015-05-12: qty 2

## 2015-05-12 MED ORDER — PEG 3350-KCL-NA BICARB-NACL 420 G PO SOLR
4000.0000 mL | Freq: Once | ORAL | Status: AC
  Administered 2015-05-12: 4000 mL via ORAL
  Filled 2015-05-12: qty 4000

## 2015-05-12 MED ORDER — SODIUM CHLORIDE 0.9 % IV SOLN
INTRAVENOUS | Status: DC
  Administered 2015-05-12: 20 mL/h via INTRAVENOUS

## 2015-05-12 MED ORDER — BUTAMBEN-TETRACAINE-BENZOCAINE 2-2-14 % EX AERO
INHALATION_SPRAY | CUTANEOUS | Status: DC | PRN
  Administered 2015-05-12: 2 via TOPICAL

## 2015-05-12 MED ORDER — DIPHENHYDRAMINE HCL 50 MG/ML IJ SOLN
INTRAMUSCULAR | Status: DC | PRN
  Administered 2015-05-12: 25 mg via INTRAVENOUS

## 2015-05-12 MED ORDER — MIDAZOLAM HCL 10 MG/2ML IJ SOLN
INTRAMUSCULAR | Status: DC | PRN
  Administered 2015-05-12 (×2): 2 mg via INTRAVENOUS

## 2015-05-12 MED ORDER — FENTANYL CITRATE (PF) 100 MCG/2ML IJ SOLN
INTRAMUSCULAR | Status: AC
  Filled 2015-05-12: qty 2

## 2015-05-12 NOTE — Progress Notes (Signed)
Patient ID: Angela Williams, female   DOB: 06/17/1971, 44 y.o.   MRN: [id number] [facility] Progress Note  Angela Williams 44 y.o. 08/11/1970   Subjective: Complains of continued RUQ pain. Denies N/V.  Objective: Vital signs in last 24 hours: Filed Vitals:   05/12/15 0513  BP: 95/68  Pulse: 70  Temp: 98.2 F (36.8 C)  Resp: 18    Physical Exam: Gen: alert, uncomfortable HEENT: anicteric CV: RRR Chest: CTA B Abd: RUQ tenderness with guarding, otherwise nontender, soft, nondistended, +BS  Lab Results:  Recent Labs  05/10/15 1742 05/11/15 0439  NA 135 135  K 3.3* 3.9  CL 103 105  CO2 24 24  GLUCOSE 140* 133*  BUN 7 7  CREATININE 0.66 0.61  CALCIUM 9.0 7.8*    Recent Labs  05/10/15 1742 05/11/15 0439  AST 16 16  ALT 12* 9*  ALKPHOS 54 45  BILITOT 0.7 0.6  PROT 7.2 5.9*  ALBUMIN 3.7 2.8*    Recent Labs  05/11/15 0439 05/12/15 0544  WBC 7.1 5.6  NEUTROABS 5.5  --   HGB 10.0* 9.9*  HCT 31.5* 31.3*  MCV 84.2 84.4  PLT 276 266   No results for input(s): LABPROT, INR in the last 72 hours.    Assessment/Plan: RUQ pain - CT unrevealing. EGD today to further evaluate. If EGD unrevealing, then will need a colonoscopy. Continue supportive care.   Briza Bark C. 05/12/2015, 10:54 AM  Pager [phone]  If no answer or after 5 PM call [phone]  

## 2015-05-12 NOTE — Brief Op Note (Signed)
Normal EGD. Colonoscopy tomorrow. Prep this evening. Clear liquid diet. NPO p MN.

## 2015-05-12 NOTE — Op Note (Signed)
[facility] [address] [place], [id number]   ENDOSCOPY PROCEDURE REPORT  PATIENT: Angela Williams, [name]  MR#: [id number] BIRTHDATE: October 29, 1970 , 44  yrs. old GENDER: female ENDOSCOPIST: Charlott RakesVincent Debralee Braaksma, MD REFERRED BY: PROCEDURE DATE:  05/12/2015 PROCEDURE:  EGD, diagnostic ASA CLASS:     Class II INDICATIONS:  abdominal pain in the upper right quadrant, nausea, and vomiting. MEDICATIONS: Fentanyl 50 mcg IV, Versed 4 mg IV, and Benadryl 25 mg IV TOPICAL ANESTHETIC: Cetacaine Spray  DESCRIPTION OF PROCEDURE: After the risks benefits and alternatives of the procedure were thoroughly explained, informed consent was obtained.  The Pentax Gastroscope [id number] endoscope was introduced through the mouth and advanced to the second portion of the duodenum , Without limitations.  The instrument was slowly withdrawn as the mucosa was fully examined. Estimated blood loss is zero unless otherwise noted in this procedure report.    Esophagus normal. GEJ 38 cm from the incisors and normal. Stomach normal. Examined duodenum normal.              The scope was then withdrawn from the patient and the procedure completed.  COMPLICATIONS: There were no immediate complications.  ENDOSCOPIC IMPRESSION:     Normal EGD  RECOMMENDATIONS:     Colonoscopy; Supportive care   eSigned:  Charlott RakesVincent Ariella Voit, MD 05/12/2015 3:55 PM    CC:  CPT CODES: ICD CODES:  The ICD and CPT codes recommended by this software are interpretations from the data that the clinical staff has captured with the software.  The verification of the translation of this report to the ICD and CPT codes and modifiers is the sole responsibility of the health care institution and practicing physician where this report was generated.  [employer] will not be held responsible for the validity of the ICD and CPT codes included on this report.  AMA assumes no liability for data contained or  not contained herein. CPT is a Publishing rights managerregistered trademark of the [employer].

## 2015-05-12 NOTE — H&P (View-Only) (Signed)
Patient ID: Angela Williams, female   DOB: 02/11/1971, 44 y.o.   MRN: [id number] [facility]  Angela LampCasty Williams 44 y.o. 02/11/1971   Subjective: Complains of continued RUQ pain. Denies N/V.  Objective: Vital signs in last 24 hours: Filed Vitals:   05/12/15 0513  BP: 95/68  Pulse: 70  Temp: 98.2 F (36.8 C)  Resp: 18    Physical Exam: Gen: alert, uncomfortable HEENT: anicteric CV: RRR Chest: CTA B Abd: RUQ tenderness with guarding, otherwise nontender, soft, nondistended, +BS  Lab Results:  Recent Labs  05/10/15 1742 05/11/15 0439  NA 135 135  K 3.3* 3.9  CL 103 105  CO2 24 24  GLUCOSE 140* 133*  BUN 7 7  CREATININE 0.66 0.61  CALCIUM 9.0 7.8*    Recent Labs  05/10/15 1742 05/11/15 0439  AST 16 16  ALT 12* 9*  ALKPHOS 54 45  BILITOT 0.7 0.6  PROT 7.2 5.9*  ALBUMIN 3.7 2.8*    Recent Labs  05/11/15 0439 05/12/15 0544  WBC 7.1 5.6  NEUTROABS 5.5  --   HGB 10.0* 9.9*  HCT 31.5* 31.3*  MCV 84.2 84.4  PLT 276 266   No results for input(s): LABPROT, INR in the last 72 hours.    Assessment/Plan: RUQ pain - CT unrevealing. EGD today to further evaluate. If EGD unrevealing, then will need a colonoscopy. Continue supportive care.   Shaylene Paganelli C. 05/12/2015, 10:54 AM  Pager [phone]  If no answer or after 5 PM call [phone]

## 2015-05-12 NOTE — Interval H&P Note (Signed)
History and Physical Interval Note:  05/12/2015 3:43 PM  Angela Williams  has presented today for surgery, with the diagnosis of n/v  The various methods of treatment have been discussed with the patient and family. After consideration of risks, benefits and other options for treatment, the patient has consented to  Procedure(s): ESOPHAGOGASTRODUODENOSCOPY (EGD) (N/A) as a surgical intervention .  The patient's history has been reviewed, patient examined, no change in status, stable for surgery.  I have reviewed the patient's chart and labs.  Questions were answered to the patient's satisfaction.     [name] C.

## 2015-05-12 NOTE — Progress Notes (Signed)
PROGRESS NOTE    Angela Williams ZOX:[id number] DOB: 1971-05-13 DOA: 05/10/2015 PCP: No PCP Per Patient  HPI/Brief narrative 44 year old female patient with history of ectopic pregnancy and gallstones presented to [facility] on 05/10/15 with complaints of epigastric and RUQ abdominal pain. She states that she has been having intermittent abdominal pain for the last month which got worse on Sunday (2 days prior to admission) associated with several episodes of nonbloody emesis but no diarrhea. Subjective fevers without chills. She claimed abdominal pain worsened with food intake. She denies NSAID use. No hematemesis or melena reported. Since admission, RUQ ultrasound showed no evidence of cholelithiasis or acute cholecystitis and HIDA scan was normal. General surgery was consulted and do not see any surgical needs and recommend outpatient follow-up. GI consulted. CT abdomen without acute findings. GI considering EGD this morning.  Assessment/Plan:  Abdominal pain with non-intractable nausea and vomiting - Etiology unclear. LFTs and lipase unremarkable. RUQ ultrasound: No cholelithiasis. No evidence of acute cholecystitis fatty liver. HIDA scan: Normal - DD: Gastritis, esophagitis, ulcer disease versus other etiologies - Gen. surgery consulted and did not see any surgical needs and recommend outpatient follow-up. D/W Tresa Endo ([facility] PA on 10/19) - Urine pregnancy test. - Urine microscopy: Not suggestive of UTI. - [facility] consulted. CT abdomen obtained and without acute findings. GI plan EGD this morning. - Persisting abdominal pain but no nausea or vomiting.  Hypokalemia - Replaced  Anemia - Likely chronic. Hemoglobin has dropped from 11.7 > 10 g per DL. Stable.  Atypical chest pain/? Left breast pain - Overnight 10/19, complained of left breast pain, exacerbated by deep breathing but without radiation. Suspect pain was likely from ongoing GI etiology - EKG without acute changes and troponin 1  negative - Seems to have resolved after GI cocktail and pain meds.    DVT prophylaxis: SCDs Code Status: Full Family Communication: None at bedside Disposition Plan: DC home when medically stable   Consultants:  General surgery  [facility]  Procedures:  None  Antibiotics:  None   Subjective: Persisting abdominal pain. No nausea or vomiting.  Objective: Filed Vitals:   05/11/15 1429 05/11/15 1949 05/11/15 2111 05/12/15 0513  BP: 112/80 122/74 94/58 95/68   Pulse: 86 79 78 70  Temp: 98.2 F (36.8 C) 98.4 F (36.9 C) 98.6 F (37 C) 98.2 F (36.8 C)  TempSrc: Oral Oral Oral Oral  Resp: Height:      Weight:      SpO2: 98% 100% 96% 97%    Intake/Output Summary (Last 24 hours) at 05/12/15 1013 Last data filed at 05/12/15 0000  Gross per 24 hour  Intake 2486.25 ml  Output      0 ml  Net 2486.25 ml   Filed Weights   05/10/15 1715  Weight: 90.719 kg (200 lb)     Exam:  General exam: Pleasant young female lying comfortably supine in bed Respiratory system: Clear. No increased work of breathing. Cardiovascular system: S1 & S2 heard, RRR. No JVD, murmurs, gallops, clicks or pedal edema. Gastrointestinal system: Abdomen is nondistended, soft. Mild tenderness in epigastric/RUQ but improved compared to yesterday, soft and no rigidity, guarding or rebound. Normal bowel sounds heard. Central nervous system: Alert and oriented. No focal neurological deficits. Extremities: Symmetric 5 x 5 power.   Data Reviewed: Basic Metabolic Panel:  Recent Labs Lab 05/10/15 1742 05/11/15 0439  NA 135 135  K 3.3* 3.9  CL 103 105  CO2 24 24  GLUCOSE  140* 133*  BUN 7 7  CREATININE 0.66 0.61  CALCIUM 9.0 7.8*   Liver Function Tests:  Recent Labs Lab 05/10/15 1742 05/11/15 0439  AST 16 16  ALT 12* 9*  ALKPHOS 54 45  BILITOT 0.7 0.6  PROT 7.2 5.9*  ALBUMIN 3.7 2.8*    Recent Labs Lab 05/10/15 1742  LIPASE 28   No results for input(s): AMMONIA  in the last 168 hours. CBC:  Recent Labs Lab 05/10/15 1742 05/11/15 0439 05/12/15 0544  WBC 6.9 7.1 5.6  NEUTROABS  --  5.5  --   HGB 11.7* 10.0* 9.9*  HCT 36.6 31.5* 31.3*  MCV 83.2 84.2 84.4  PLT 316 276 266   Cardiac Enzymes:  Recent Labs Lab 05/11/15 2215  TROPONINI <0.03   BNP (last 3 results) No results for input(s): PROBNP in the last 8760 hours. CBG: No results for input(s): GLUCAP in the last 168 hours.  No results found for this or any previous visit (from the past 240 hour(s)).         Studies: Nm Hepatobiliary Liver Func  05/11/2015  CLINICAL DATA:  Nausea and vomiting.  Abdominal pain. EXAM: NUCLEAR MEDICINE HEPATOBILIARY IMAGING WITH GALLBLADDER EF TECHNIQUE: Sequential images of the abdomen were obtained out to 60 minutes following intravenous administration of radiopharmaceutical. After slow intravenous infusion of micrograms Cholecystokinin, gallbladder ejection fraction was determined. RADIOPHARMACEUTICALS:  mCi Tc-70m Choletec IV COMPARISON:  Ultrasound 05/10/2015 FINDINGS: Liver, biliary system, gallbladder, bowel visualize normally. Gallbladder ejection fraction at 45 min is 91%. At 45 min, normal ejection fraction is greater than 40%. IMPRESSION: Normal exam. Electronically Signed   By: Maisie Fus  Register   On: 05/11/2015 13:38   Ct Abdomen Pelvis W Contrast  05/11/2015  CLINICAL DATA:  Right-sided and general abdominal pain for 3 days. Nausea and vomiting. Evaluate for small bowel obstruction. EXAM: CT ABDOMEN AND PELVIS WITH CONTRAST TECHNIQUE: Multidetector CT imaging of the abdomen and pelvis was performed using the standard protocol following bolus administration of intravenous contrast. CONTRAST:  OMNIPAQUE IOHEXOL 300 MG/ML  SOLN COMPARISON:  Abdominal ultrasound yesterday.  CT 05/1912 FINDINGS: Lower chest: Minimal right pleural thickening and atelectasis in the right lung base. The heart size is normal. Liver: Heterogeneous parenchyma  suggestive of steatosis. No focal lesion. Hepatobiliary: No calcified gallstone.  No biliary dilatation. Pancreas: No ductal dilatation, peripancreatic inflammatory change or focal lesion. Spleen: Normal. Adrenal glands: No nodule. Kidneys: Symmetric renal enhancement and excretion. No hydronephrosis. No perinephric stranding. Stomach/Bowel: Stomach physiologically distended. There are no dilated or thickened small bowel loops. No small bowel obstruction. Small volume of stool throughout the colon without colonic wall thickening. There is submucosal fatty infiltration throughout the colon. No pericolonic inflammatory change. The appendix is normal. Vascular/Lymphatic: No retroperitoneal adenopathy. Abdominal aorta is normal in caliber. Reproductive: The uterus is normal. Ovaries appear symmetric in size. No adnexal mass. Bladder: Near completely decompressed and not well evaluated. Other: No free air, free fluid, or intra-abdominal fluid collection. Fat containing umbilical hernia. Musculoskeletal: There are no acute or suspicious osseous abnormalities. Sclerosis about both sacroiliac joints. Sclerotic density in the left acetabulum, unchanged. IMPRESSION: 1. No acute abnormality in the abdomen/pelvis. Particularly no bowel obstruction. 2. Submucosal fatty infiltration throughout the colon, suggests chronic or prior inflammation. No bowel inflammation is seen at this time. 3. Hepatic steatosis. Electronically Signed   By: Rubye Oaks M.D.   On: 05/11/2015 23:59   US Abdomen Limited Ruq  05/10/2015  CLINICAL DATA:  Right upper  quadrant abdominal pain with nausea and vomiting 3 days. EXAM: US ABDOMEN LIMITED - RIGHT UPPER QUADRANT COMPARISON:  06/10/2012 CT abdomen/pelvis FINDINGS: Gallbladder: Slightly contracted gallbladder. No gallbladder wall thickening. No gallstones. No pericholecystic fluid. No sonographic Murphy sign. Common bile duct: Diameter: 4 mm Liver: Liver parenchyma is diffusely mildly  heterogeneously echogenic with mild posterior acoustic attenuation, suggesting mild diffuse hepatic steatosis. No liver mass is detected, noting decreased sensitivity in the setting of an echogenic liver. Patent main portal vein with appropriate flow direction. IMPRESSION: 1. No cholelithiasis.  No evidence of acute cholecystitis. 2. Slightly contracted gallbladder. Given that the patient is in a fasting state, this is an abnormal finding and suggests biliary dyskinesia, which could be further evaluated on an outpatient basis with hepatobiliary scintigraphy with CCK as clinically warranted. 3. No biliary ductal dilatation. 4. Mild diffuse hepatic steatosis. Electronically Signed   By: Delbert PhenixJason A Poff M.D.   On: 05/10/2015 19:28        Scheduled Meds: . pantoprazole (PROTONIX) IV  40 mg Intravenous Q24H  . sodium chloride  3 mL Intravenous Q12H   Continuous Infusions: . 0.9 % NaCl with KCl 20 mEq / L 50 mL/hr at 05/11/15 1450    Principal Problem:   Intractable vomiting Active Problems:   Hypokalemia   Dehydration   Abdominal pain   NASH (nonalcoholic steatohepatitis)   Emesis    Time spent: 20 minutes.    Marcellus ScottHONGALGI,Julissa Browning, MD, FACP, FHM. [facility] Pager [phone]  If 7PM-7AM, please contact night-coverage [email or web address] Password [email or web address] 05/12/2015, 10:13 AM

## 2015-05-12 NOTE — Care Management Note (Signed)
Case Management Note  Patient Details  Name: Angela Williams MRN: [id number] Date of Birth: Apr 08, 1971  Subjective/Objective:                  Date-05-12-15 Initial Assessment  Spoke with patient at the bedside. Introduced self as Sports coachcase manager and explained role in discharge planning and how to be reached.  Verified patient lives at home alone.  Verified patient anticipates to go home at time of discharge. Patient has no DME. Expressed potential need for no other DME.  Patient confirmed  needing help with their medication. [facility]. CM explained to patient that they may use the on site pharmacy to fill prescriptions given to them at discharge. Patient aware that [facility] will not fill narcotics or pain medications prior to the patient being seen by one of their physicians.  Patient aware that they must be seen as a patient prior to the pharmacy filling the prescriptions a second time.  Patient drives to MD appointments.  Verified patient has PCP name. Patient states they currently receive HH services through no one.   Plan: CM will continue to follow for discharge planning and [facility] resources.   Lawerance Sabalebbie Emilian Stawicki RN BSN CM [phone]   Action/Plan:   Expected Discharge Date:                  Expected Discharge Plan:  Home/Self Care  In-House Referral:     Discharge planning Services  CM Consult, [facility]  Post Acute Care Choice:    Choice offered to:     DME Arranged:    DME Agency:     HH Arranged:    HH Agency:     Status of Service:  Completed, signed off  Medicare Important Message Given:    Date Medicare IM Given:    Medicare IM give by:    Date Additional Medicare IM Given:    Additional Medicare Important Message give by:     If discussed at Long Length of Stay Meetings, dates discussed:    Additional  Comments:  Lawerance SabalDebbie Kentrel Clevenger, RN 05/12/2015, 11:11 AM

## 2015-05-13 ENCOUNTER — Encounter (HOSPITAL_COMMUNITY): Payer: Self-pay | Admitting: Gastroenterology

## 2015-05-13 ENCOUNTER — Inpatient Hospital Stay: Payer: Self-pay

## 2015-05-13 ENCOUNTER — Encounter (HOSPITAL_COMMUNITY)
Admission: EM | Disposition: A | Discharge status: Home / Self Care | Payer: Self-pay | Source: Home / Self Care | Attending: Emergency Medicine

## 2015-05-13 ENCOUNTER — Observation Stay (HOSPITAL_COMMUNITY): Payer: Self-pay | Admitting: Anesthesiology

## 2015-05-13 ENCOUNTER — Observation Stay (HOSPITAL_COMMUNITY): Payer: MEDICAID | Admitting: Anesthesiology

## 2015-05-13 DIAGNOSIS — R109 Unspecified abdominal pain: Secondary | ICD-10-CM

## 2015-05-13 DIAGNOSIS — R1110 Vomiting, unspecified: Secondary | ICD-10-CM

## 2015-05-13 HISTORY — PX: COLONOSCOPY: SHX5424

## 2015-05-13 SURGERY — COLONOSCOPY
Anesthesia: Monitor Anesthesia Care

## 2015-05-13 MED ORDER — PROPOFOL 10 MG/ML IV BOLUS
INTRAVENOUS | Status: DC | PRN
  Administered 2015-05-13: 10 mg via INTRAVENOUS
  Administered 2015-05-13: 20 mg via INTRAVENOUS
  Administered 2015-05-13: 10 mg via INTRAVENOUS

## 2015-05-13 MED ORDER — LACTATED RINGERS IV SOLN
INTRAVENOUS | Status: DC | PRN
  Administered 2015-05-13: 13:00:00 via INTRAVENOUS

## 2015-05-13 MED ORDER — MAGNESIUM CITRATE PO SOLN
1.0000 | Freq: Once | ORAL | Status: AC
  Administered 2015-05-13: 1 via ORAL
  Filled 2015-05-13: qty 296

## 2015-05-13 MED ORDER — PROPOFOL 500 MG/50ML IV EMUL
INTRAVENOUS | Status: DC | PRN
  Administered 2015-05-13: 100 ug/kg/min via INTRAVENOUS

## 2015-05-13 MED ORDER — ONDANSETRON HCL 4 MG/2ML IJ SOLN: 4.0000 mg | Freq: Once | INTRAMUSCULAR | Status: DC | PRN

## 2015-05-13 NOTE — Progress Notes (Signed)
Pt completed GoLytely, but stool is still soft. Dr. Ewing SchleinMagod notified. Orders received.

## 2015-05-13 NOTE — Anesthesia Preprocedure Evaluation (Addendum)
Anesthesia Evaluation  Patient identified by MRN, date of birth, ID band Patient awake    Reviewed: Allergy & Precautions, H&P , NPO status , Patient's Chart, lab work & pertinent test results  History of Anesthesia Complications Negative for: history of anesthetic complications  Airway Mallampati: II  TM Distance: >3 FB Neck ROM: full    Dental no notable dental hx. (+) Teeth Intact   Pulmonary neg pulmonary ROS,    Pulmonary exam normal breath sounds clear to auscultation       Cardiovascular negative cardio ROS Normal cardiovascular exam Rhythm:regular Rate:Normal     Neuro/Psych negative neurological ROS     GI/Hepatic negative GI ROS, (+) Hepatitis -NASH   Endo/Other  obesity  Renal/GU negative Renal ROS     Musculoskeletal   Abdominal (+) + obese,   Peds  Hematology  (+) anemia ,   Anesthesia Other Findings Denies cardiac or pulmonary history  Assessed at 1320  Reproductive/Obstetrics negative OB ROS                         Anesthesia Physical Anesthesia Plan  ASA: II  Anesthesia Plan: MAC   Post-op Pain Management:    Induction: Intravenous  Airway Management Planned: Simple Face Mask  Additional Equipment:   Intra-op Plan:   Post-operative Plan:   Informed Consent: I have reviewed the patients History and Physical, chart, labs and discussed the procedure including the risks, benefits and alternatives for the proposed anesthesia with the patient or authorized representative who has indicated his/her understanding and acceptance.   Dental Advisory Given  Plan Discussed with: Anesthesiologist, CRNA and Surgeon  Anesthesia Plan Comments: (Prop infusion)      Anesthesia Quick Evaluation

## 2015-05-13 NOTE — Interval H&P Note (Signed)
History and Physical Interval Note:  05/13/2015 1:14 PM  Angela Williams  has presented today for surgery, with the diagnosis of RUQ abd pain  The various methods of treatment have been discussed with the patient and family. After consideration of risks, benefits and other options for treatment, the patient has consented to  Procedure(s): COLONOSCOPY (N/A) as a surgical intervention .  The patient's history has been reviewed, patient examined, no change in status, stable for surgery.  I have reviewed the patient's chart and labs.  Questions were answered to the patient's satisfaction.     Donyel Castagnola C.

## 2015-05-13 NOTE — Anesthesia Postprocedure Evaluation (Signed)
  Anesthesia Post-op Note  Patient: Angela Williams  Procedure(s) Performed: Procedure(s): COLONOSCOPY (N/A)  Patient Location: Endoscopy Unit  Anesthesia Type:MAC  Level of Consciousness: awake, alert  and patient cooperative  Airway and Oxygen Therapy: Patient Spontanous Breathing and Patient connected to nasal cannula oxygen  Post-op Pain: none  Post-op Assessment: Post-op Vital signs reviewed, Patient's Cardiovascular Status Stable, Respiratory Function Stable, Patent Airway and No signs of Nausea or vomiting              Post-op Vital Signs: Reviewed and stable  Last Vitals:  Filed Vitals:   05/13/15 1234  BP: 128/76  Pulse: 68  Temp: 36.7 C  Resp: 14    Complications: No apparent anesthesia complications

## 2015-05-13 NOTE — Brief Op Note (Signed)
Internal hemorrhoids otherwise normal colonoscopy. No source of abdominal pain. Advance diet as tolerated. Question MSK source of pain vs. Colonic spasms. Laxatives prn. Will sign off. Call if questions. Dr. Randa EvensEdwards available to see this weekend if needed. F/U with GI prn.

## 2015-05-13 NOTE — Anesthesia Postprocedure Evaluation (Signed)
  Anesthesia Post-op Note  Patient: Angela Williams  Procedure(s) Performed: Procedure(s): COLONOSCOPY (N/A)  Patient Location: Endoscopy Unit  Anesthesia Type:MAC  Level of Consciousness: awake  Airway and Oxygen Therapy: Patient Spontanous Breathing  Post-op Pain: none  Post-op Assessment: Post-op Vital signs reviewed, Patient's Cardiovascular Status Stable, Respiratory Function Stable, Patent Airway, No signs of Nausea or vomiting and Pain level controlled              Post-op Vital Signs: Reviewed and stable  Last Vitals:  Filed Vitals:   05/13/15 1430  BP: 110/63  Pulse: 79  Temp:   Resp: 14    Complications: No apparent anesthesia complications

## 2015-05-13 NOTE — Op Note (Signed)
[facility] [address] [place], [id number]   COLONOSCOPY PROCEDURE REPORT     EXAM DATE: 05/13/2015  PATIENT NAME:      Angela Williams, Angela Williams           MR #:      [id number] BIRTHDATE:       1971/07/13      VISIT #:     [id number]  ATTENDING:     Charlott RakesVincent Aiana Nordquist, MD     STATUS:     outpatient REFERRING MD:      hospital team ASA CLASS:        Class II  INDICATIONS:  The patient is a 44 yr old female here for a colonoscopy due to abdominal pain in the upper right quadrant.  PROCEDURE PERFORMED:     Colonoscopy, diagnostic MEDICATIONS:     Monitored anesthesia care and Per Anesthesia  ESTIMATED BLOOD LOSS:     None  CONSENT: The patient understands the risks and benefits of the procedure and understands that these risks include, but are not limited to: sedation, allergic reaction, infection, perforation and/or bleeding. Alternative means of evaluation and treatment include, among others: physical exam, x-rays, and/or surgical intervention. The patient elects to proceed with this endoscopic procedure.  DESCRIPTION OF PROCEDURE: During intra-op preparation period all mechanical & medical equipment was checked for proper function. Hand hygiene and appropriate measures for infection prevention was taken. After the risks, benefits and alternatives of the procedure were thoroughly explained, Informed consent was verified, confirmed and timeout was successfully executed by the treatment team. A digital exam revealed no abnormalities of the rectum.      The Pentax Ped Colon [id number] endoscope was introduced through the anus and advanced to the cecum, which was identified by both the appendix and ileocecal valve. In order to reach the cecum repeated loop reduction was necessary. The prep was fair.. The instrument was then slowly withdrawn as the colon was fully examined. Estimated blood loss is zero unless otherwise noted in this procedure report. No  mucosal abnormalities were seen.      Retroflexed views revealed small internal hemorrhoids.  The scope was then completely withdrawn from the patient and the procedure terminated.     ADVERSE EVENTS:      There were no immediate complications.   IMPRESSIONS:     Small internal hemorrhoids o/w normal colonoscopy No source of abdominal pain seen; question colonic spasms vs MSK source  RECOMMENDATIONS:     Supportive care; Advance diet as tolerated    Charlott RakesVincent Jonah Gingras, MD eSigned:  Charlott RakesVincent Aylen Stradford, MD 05/13/2015 2:18 PM   cc:  CPT CODES: ICD CODES:  The ICD and CPT codes recommended by this software are interpretations from the data that the clinical staff has captured with the software.  The verification of the translation of this report to the ICD and CPT codes and modifiers is the sole responsibility of the health care institution and practicing physician where this report was generated.  [employer] will not be held responsible for the validity of the ICD and CPT codes included on this report.  AMA assumes no liability for data contained or not contained herein. CPT is a Publishing rights managerregistered trademark of the [employer].

## 2015-05-13 NOTE — Transfer of Care (Signed)
Immediate Anesthesia Transfer of Care Note  Patient: Angela Williams  Procedure(s) Performed: Procedure(s): COLONOSCOPY (N/A)  Patient Location: Endoscopy Unit  Anesthesia Type:MAC  Level of Consciousness: awake, alert  and patient cooperative  Airway & Oxygen Therapy: Patient Spontanous Breathing and Patient connected to nasal cannula oxygen  Post-op Assessment: Report given to RN, Post -op Vital signs reviewed and stable and Patient moving all extremities  Post vital signs: Reviewed and stable  Last Vitals:  Filed Vitals:   05/13/15 1234  BP: 128/76  Pulse: 68  Temp: 36.7 C  Resp: 14    Complications: No apparent anesthesia complications

## 2015-05-13 NOTE — Progress Notes (Signed)
PROGRESS NOTE    Angela Williams ZOX:[id number] DOB: 1971/06/21 DOA: 05/10/2015 PCP: No PCP Per Patient  HPI/Brief narrative 44 year old female patient with history of ectopic pregnancy and gallstones presented to [facility] on 05/10/15 with complaints of epigastric and RUQ abdominal pain. She states that she has been having intermittent abdominal pain for the last month which got worse on Sunday (2 days prior to admission) associated with several episodes of nonbloody emesis but no diarrhea. Subjective fevers without chills. She claimed abdominal pain worsened with food intake. She denies NSAID use. No hematemesis or melena reported. Since admission, RUQ ultrasound showed no evidence of cholelithiasis or acute cholecystitis and HIDA scan was normal. General surgery was consulted and do not see any surgical needs and recommend outpatient follow-up. GI consulted. CT abdomen without acute findings. S/p EGD- now for colonscopy  Assessment/Plan:  Abdominal pain with non-intractable nausea and vomiting - Etiology unclear. LFTs and lipase unremarkable. RUQ ultrasound: No cholelithiasis. No evidence of acute cholecystitis fatty liver. HIDA scan: Normal - DD: Gastritis, esophagitis, ulcer disease versus other etiologies - Gen. surgery consulted and did not see any surgical needs and recommend outpatient follow-up. D/W Tresa Endo ([facility] on 10/19) - Urine pregnancy test negative - Urine microscopy: Not suggestive of UTI. - [facility] consulted. CT abdomen obtained and without acute findings.  -s/p EGD: normal -colonoscopy 10/21 -patient states pain improved after EGD from a 10 to a 5  Hypokalemia - Replaced  Anemia - Likely chronic. Hemoglobin has dropped from 11.7 > 10 g per DL. Stable.     DVT prophylaxis: SCDs Code Status: Full Family Communication: family at bedside Disposition Plan: DC home when medically stable   Consultants:  [facility]  Procedures:  None  Antibiotics:  None    Subjective: Pain improved but not gone  Objective: Filed Vitals:   05/12/15 1618 05/12/15 2116 05/13/15 0509 05/13/15 0847  BP: 132/93 114/73 103/62 116/61  Pulse: 66 83 69 72  Temp:  98.3 F (36.8 C) 98 F (36.7 C) 97.8 F (36.6 C)  TempSrc:  Oral Oral Oral  Resp: Height:      Weight:      SpO2: 97% 98% 97% 96%    Intake/Output Summary (Last 24 hours) at 05/13/15 1121 Last data filed at 05/13/15 1115  Gross per 24 hour  Intake   1153 ml  Output      1 ml  Net   1152 ml   Filed Weights   05/10/15 1715  Weight: 90.719 kg (200 lb)     Exam:  General exam: Pleasant young female lying comfortably supine in bed Respiratory system: Clear. No increased work of breathing. Cardiovascular system: S1 & S2 heard, RRR. No JVD, murmurs, gallops, clicks or pedal edema. Gastrointestinal system: abd mildly distended, non tender, +BS Central nervous system: Alert and oriented. No focal neurological deficits. Extremities: Symmetric 5 x 5 power.   Data Reviewed: Basic Metabolic Panel:  Recent Labs Lab 05/10/15 1742 05/11/15 0439  NA 135 135  K 3.3* 3.9  CL 103 105  CO2 24 24  GLUCOSE 140* 133*  BUN 7 7  CREATININE 0.66 0.61  CALCIUM 9.0 7.8*   Liver Function Tests:  Recent Labs Lab 05/10/15 1742 05/11/15 0439  AST 16 16  ALT 12* 9*  ALKPHOS 54 45  BILITOT 0.7 0.6  PROT 7.2 5.9*  ALBUMIN 3.7 2.8*    Recent Labs Lab 05/10/15 1742  LIPASE 28   No results  for input(s): AMMONIA in the last 168 hours. CBC:  Recent Labs Lab 05/10/15 1742 05/11/15 0439 05/12/15 0544  WBC 6.9 7.1 5.6  NEUTROABS  --  5.5  --   HGB 11.7* 10.0* 9.9*  HCT 36.6 31.5* 31.3*  MCV 83.2 84.2 84.4  PLT 316 276 266   Cardiac Enzymes:  Recent Labs Lab 05/11/15 2215  TROPONINI <0.03   BNP (last 3 results) No results for input(s): PROBNP in the last 8760 hours. CBG: No results for input(s): GLUCAP in the last 168 hours.  No results found for this or any  previous visit (from the past 240 hour(s)).         Studies: Nm Hepatobiliary Liver Func  05/11/2015  CLINICAL DATA:  Nausea and vomiting.  Abdominal pain. EXAM: NUCLEAR MEDICINE HEPATOBILIARY IMAGING WITH GALLBLADDER EF TECHNIQUE: Sequential images of the abdomen were obtained out to 60 minutes following intravenous administration of radiopharmaceutical. After slow intravenous infusion of micrograms Cholecystokinin, gallbladder ejection fraction was determined. RADIOPHARMACEUTICALS:  mCi Tc-2962m Choletec IV COMPARISON:  Ultrasound 05/10/2015 FINDINGS: Liver, biliary system, gallbladder, bowel visualize normally. Gallbladder ejection fraction at 45 min is 91%. At 45 min, normal ejection fraction is greater than 40%. IMPRESSION: Normal exam. Electronically Signed   By: Maisie Fushomas  Register   On: 05/11/2015 13:38   Ct Abdomen Pelvis W Contrast  05/11/2015  CLINICAL DATA:  Right-sided and general abdominal pain for 3 days. Nausea and vomiting. Evaluate for small bowel obstruction. EXAM: CT ABDOMEN AND PELVIS WITH CONTRAST TECHNIQUE: Multidetector CT imaging of the abdomen and pelvis was performed using the standard protocol following bolus administration of intravenous contrast. CONTRAST:  100mL OMNIPAQUE IOHEXOL 300 MG/ML  SOLN COMPARISON:  Abdominal ultrasound yesterday.  CT 05/1912 FINDINGS: Lower chest: Minimal right pleural thickening and atelectasis in the right lung base. The heart size is normal. Liver: Heterogeneous parenchyma suggestive of steatosis. No focal lesion. Hepatobiliary: No calcified gallstone.  No biliary dilatation. Pancreas: No ductal dilatation, peripancreatic inflammatory change or focal lesion. Spleen: Normal. Adrenal glands: No nodule. Kidneys: Symmetric renal enhancement and excretion. No hydronephrosis. No perinephric stranding. Stomach/Bowel: Stomach physiologically distended. There are no dilated or thickened small bowel loops. No small bowel obstruction. Small volume of stool  throughout the colon without colonic wall thickening. There is submucosal fatty infiltration throughout the colon. No pericolonic inflammatory change. The appendix is normal. Vascular/Lymphatic: No retroperitoneal adenopathy. Abdominal aorta is normal in caliber. Reproductive: The uterus is normal. Ovaries appear symmetric in size. No adnexal mass. Bladder: Near completely decompressed and not well evaluated. Other: No free air, free fluid, or intra-abdominal fluid collection. Fat containing umbilical hernia. Musculoskeletal: There are no acute or suspicious osseous abnormalities. Sclerosis about both sacroiliac joints. Sclerotic density in the left acetabulum, unchanged. IMPRESSION: 1. No acute abnormality in the abdomen/pelvis. Particularly no bowel obstruction. 2. Submucosal fatty infiltration throughout the colon, suggests chronic or prior inflammation. No bowel inflammation is seen at this time. 3. Hepatic steatosis. Electronically Signed   By: Rubye OaksMelanie  Ehinger M.D.   On: 05/11/2015 23:59        Scheduled Meds: . pantoprazole (PROTONIX) IV  40 mg Intravenous Q24H  . sodium chloride  3 mL Intravenous Q12H   Continuous Infusions: . sodium chloride 20 mL/hr (05/12/15 1701)  . 0.9 % NaCl with KCl 20 mEq / L 50 mL/hr at 05/12/15 1344    Principal Problem:   Intractable vomiting Active Problems:   Hypokalemia   Dehydration   Abdominal pain   NASH (nonalcoholic steatohepatitis)  Emesis    Time spent: 25 minutes.    Marlin Canary, DO [facility] Pager [phone]  If 7PM-7AM, please contact night-coverage [email or web address] Password [email or web address] 05/13/2015, 11:21 AM

## 2015-05-14 DIAGNOSIS — E876 Hypokalemia: Secondary | ICD-10-CM

## 2015-05-14 LAB — BASIC METABOLIC PANEL
ANION GAP: 9 (ref 5–15)
CHLORIDE: 104 mmol/L (ref 101–111)
CO2: 25 mmol/L (ref 22–32)
Calcium: 8.5 mg/dL — ABNORMAL LOW (ref 8.9–10.3)
Creatinine, Ser: 0.7 mg/dL (ref 0.44–1.00)
GFR calc Af Amer: >60 mL/min (ref >60)
GFR calc non Af Amer: >60 mL/min (ref >60)
GLUCOSE: 129 mg/dL — AB (ref 65–99)
POTASSIUM: 3.6 mmol/L (ref 3.5–5.1)
Sodium: 138 mmol/L (ref 135–145)

## 2015-05-14 LAB — CBC
HEMATOCRIT: 32 % — AB (ref 36.0–46.0)
Hemoglobin: 10.3 g/dL — ABNORMAL LOW (ref 12.0–15.0)
MCH: 26.9 pg (ref 26.0–34.0)
MCHC: 32.2 g/dL (ref 30.0–36.0)
MCV: 83.6 fL (ref 78.0–100.0)
Platelets: 272 10*3/uL (ref 150–400)
RBC: 3.83 MIL/uL — AB (ref 3.87–5.11)
RDW: 14.6 % (ref 11.5–15.5)
WBC: 6.7 10*3/uL (ref 4.0–10.5)

## 2015-05-14 MED ORDER — TRAMADOL HCL 50 MG PO TABS: 50.0000 mg | ORAL_TABLET | Freq: Two times a day (BID) | ORAL | Status: DC | PRN

## 2015-05-14 MED ORDER — PANTOPRAZOLE SODIUM 40 MG PO TBEC: 40.0000 mg | DELAYED_RELEASE_TABLET | Freq: Every day | ORAL | Status: AC

## 2015-05-14 MED ORDER — PANTOPRAZOLE SODIUM 40 MG PO TBEC
40.0000 mg | DELAYED_RELEASE_TABLET | Freq: Every day | ORAL | Status: DC
  Administered 2015-05-14: 40 mg via ORAL
  Filled 2015-05-14: qty 1

## 2015-05-14 NOTE — Progress Notes (Signed)
Nsg Discharge Note  Admit Date:  05/10/2015 Discharge date: 05/14/2015   Angela Williams to be D/C'd Home per MD order.  AVS completed.  Copy for chart, and copy for patient signed, and dated. Patient/caregiver able to verbalize understanding.  Discharge Medication:   Medication List    TAKE these medications        pantoprazole 40 MG tablet  Commonly known as:  PROTONIX  Take 1 tablet (40 mg total) by mouth daily.        Discharge Assessment: Filed Vitals:   05/14/15 0524  BP: 102/57  Pulse: 77  Temp: 98.1 F (36.7 C)  Resp: 16   Skin clean, dry and intact without evidence of skin break down, no evidence of skin tears noted. IV catheter discontinued intact. Site without signs and symptoms of complications - no redness or edema noted at insertion site, patient denies c/o pain - only slight tenderness at site.  Dressing with slight pressure applied.  D/c Instructions-Education: Discharge instructions given to patient/family with verbalized understanding. D/c education completed with patient/family including follow up instructions, medication list, d/c activities limitations if indicated, with other d/c instructions as indicated by MD - patient able to verbalize understanding, all questions fully answered. Patient instructed to return to ED, call 911, or call MD for any changes in condition.  Patient escorted via WC, and D/C home via private auto.  Camillo FlamingVicki L Jaidin Richison, RN 05/14/2015 11:11 AM

## 2015-05-14 NOTE — Discharge Summary (Signed)
Physician Discharge Summary  Angela Williams WJX:[id number] DOB: 07-05-1971 DOA: 05/10/2015  PCP: No PCP Per Patient  Admit date: 05/10/2015 Discharge date: 05/14/2015  Time spent: 35 minutes  Recommendations for Outpatient Follow-up:  1. Avoid constipation  Discharge Diagnoses:  Principal Problem:   Intractable vomiting Active Problems:   Hypokalemia   Dehydration   Abdominal pain   NASH (nonalcoholic steatohepatitis)   Emesis   Discharge Condition: improved   Filed Weights   05/10/15 1715  Weight: 90.719 kg (200 lb)    History of present illness:  Angela Williams is a 44 y.o. female with past medical history of ectopic pregnancy and gallstones who comes to the emergency department due to right upper quadrant and epigastric abdominal pain since Saturday morning. The pain is worsened by food intake, but the patient does not remember the initial episode being triggered by food intake. She has had several episodes of nausea and emesis, particularly after she eats. She denies melena, hematochezia, constipation, but states that she has had one episode of mildly loose stools today. She says that she was told that she had gallstones in the past. She denies chest pain, palpitations, dyspnea, diaphoresis, pitting edema all the lower extremities. She denies dysuria or frequency.  When seen in the emergency department, the patient was in no acute distress, but still having pain and some nausea even though she was medicated previously with Tylenol with 1 mg IV push twice, a milligrams of Zofran and Reglan IV.  Hospital Course:  Abdominal pain with non-intractable nausea and vomiting - Etiology unclear. LFTs and lipase unremarkable. RUQ ultrasound: No cholelithiasis. No evidence of acute cholecystitis fatty liver. HIDA scan: Normal - DD: Gastritis, esophagitis, ulcer disease versus other etiologies - Gen. surgery consulted and did not see any surgical needs and recommend outpatient follow-up. D/W  Tresa Endo ([facility] on 10/19) - Urine pregnancy test negative - Urine microscopy: Not suggestive of UTI. - [facility] consulted. CT abdomen obtained and without acute findings.  -s/p EGD: normal -colonoscopy 10/21 -patient states pain improved  Hypokalemia - Replaced  Anemia - Likely chronic. stable   Procedures:  EGD  colonoscopy  Consultations:  GI  Discharge Exam: Filed Vitals:   05/14/15 0524  BP: 102/57  Pulse: 77  Temp: 98.1 F (36.7 C)  Resp: 16      Discharge Instructions    There are no discharge medications for this patient.  Allergies  Allergen Reactions  . Codeine Other (See Comments)    Will not wake up.   Follow-up Information    Follow up with [facility] On 05/19/2015.   Why:  at 10:30am. Please arrive 10 minutes early and bring your photo ID. You may fill your prescriptions here when you are discharged.   Contact information:   [address] [place] [address] [phone]       The results of significant diagnostics from this hospitalization (including imaging, microbiology, ancillary and laboratory) are listed below for reference.    Significant Diagnostic Studies: Nm Hepatobiliary Liver Func  05/11/2015  CLINICAL DATA:  Nausea and vomiting.  Abdominal pain. EXAM: NUCLEAR MEDICINE HEPATOBILIARY IMAGING WITH GALLBLADDER EF TECHNIQUE: Sequential images of the abdomen were obtained out to 60 minutes following intravenous administration of radiopharmaceutical. After slow intravenous infusion of micrograms Cholecystokinin, gallbladder ejection fraction was determined. RADIOPHARMACEUTICALS:  mCi Tc-73m Choletec IV COMPARISON:  Ultrasound 05/10/2015 FINDINGS: Liver, biliary system, gallbladder, bowel visualize normally. Gallbladder ejection fraction at 45 min is 91%.  At 45 min, normal ejection fraction is greater than 40%. IMPRESSION: Normal exam. Electronically Signed   By: Maisie Fushomas  Register    On: 05/11/2015 13:38   Ct Abdomen Pelvis W Contrast  05/11/2015  CLINICAL DATA:  Right-sided and general abdominal pain for 3 days. Nausea and vomiting. Evaluate for small bowel obstruction. EXAM: CT ABDOMEN AND PELVIS WITH CONTRAST TECHNIQUE: Multidetector CT imaging of the abdomen and pelvis was performed using the standard protocol following bolus administration of intravenous contrast. CONTRAST:  100mL OMNIPAQUE IOHEXOL 300 MG/ML  SOLN COMPARISON:  Abdominal ultrasound yesterday.  CT 05/1912 FINDINGS: Lower chest: Minimal right pleural thickening and atelectasis in the right lung base. The heart size is normal. Liver: Heterogeneous parenchyma suggestive of steatosis. No focal lesion. Hepatobiliary: No calcified gallstone.  No biliary dilatation. Pancreas: No ductal dilatation, peripancreatic inflammatory change or focal lesion. Spleen: Normal. Adrenal glands: No nodule. Kidneys: Symmetric renal enhancement and excretion. No hydronephrosis. No perinephric stranding. Stomach/Bowel: Stomach physiologically distended. There are no dilated or thickened small bowel loops. No small bowel obstruction. Small volume of stool throughout the colon without colonic wall thickening. There is submucosal fatty infiltration throughout the colon. No pericolonic inflammatory change. The appendix is normal. Vascular/Lymphatic: No retroperitoneal adenopathy. Abdominal aorta is normal in caliber. Reproductive: The uterus is normal. Ovaries appear symmetric in size. No adnexal mass. Bladder: Near completely decompressed and not well evaluated. Other: No free air, free fluid, or intra-abdominal fluid collection. Fat containing umbilical hernia. Musculoskeletal: There are no acute or suspicious osseous abnormalities. Sclerosis about both sacroiliac joints. Sclerotic density in the left acetabulum, unchanged. IMPRESSION: 1. No acute abnormality in the abdomen/pelvis. Particularly no bowel obstruction. 2. Submucosal fatty infiltration  throughout the colon, suggests chronic or prior inflammation. No bowel inflammation is seen at this time. 3. Hepatic steatosis. Electronically Signed   By: Rubye OaksMelanie  Ehinger M.D.   On: 05/11/2015 23:59   [place] Abdomen Limited Ruq  05/10/2015  CLINICAL DATA:  Right upper quadrant abdominal pain with nausea and vomiting 3 days. EXAM: US ABDOMEN LIMITED - RIGHT UPPER QUADRANT COMPARISON:  06/10/2012 CT abdomen/pelvis FINDINGS: Gallbladder: Slightly contracted gallbladder. No gallbladder wall thickening. No gallstones. No pericholecystic fluid. No sonographic Murphy sign. Common bile duct: Diameter: 4 mm Liver: Liver parenchyma is diffusely mildly heterogeneously echogenic with mild posterior acoustic attenuation, suggesting mild diffuse hepatic steatosis. No liver mass is detected, noting decreased sensitivity in the setting of an echogenic liver. Patent main portal vein with appropriate flow direction. IMPRESSION: 1. No cholelithiasis.  No evidence of acute cholecystitis. 2. Slightly contracted gallbladder. Given that the patient is in a fasting state, this is an abnormal finding and suggests biliary dyskinesia, which could be further evaluated on an outpatient basis with hepatobiliary scintigraphy with CCK as clinically warranted. 3. No biliary ductal dilatation. 4. Mild diffuse hepatic steatosis. Electronically Signed   By: Delbert PhenixJason A Poff M.D.   On: 05/10/2015 19:28    Microbiology: No results found for this or any previous visit (from the past 240 hour(s)).   Labs: Basic Metabolic Panel:  Recent Labs Lab 05/10/15 1742 05/11/15 0439 05/14/15 0410  NA 135 135 138  K 3.3* 3.9 3.6  CL 103 105 104  CO2 24 24 25   GLUCOSE 140* 133* 129*  BUN 7 7 <5*  CREATININE 0.66 0.61 0.70  CALCIUM 9.0 7.8* 8.5*   Liver Function Tests:  Recent Labs Lab 05/10/15 1742 05/11/15 0439  AST 16 16  ALT 12* 9*  ALKPHOS 54 45  BILITOT 0.7  0.6  PROT 7.2 5.9*  ALBUMIN 3.7 2.8*    Recent Labs Lab 05/10/15 1742   LIPASE 28   No results for input(s): AMMONIA in the last 168 hours. CBC:  Recent Labs Lab 05/10/15 1742 05/11/15 0439 05/12/15 0544 05/14/15 0410  WBC 6.9 7.1 5.6 6.7  NEUTROABS  --  5.5  --   --   HGB 11.7* 10.0* 9.9* 10.3*  HCT 36.6 31.5* 31.3* 32.0*  MCV 83.2 84.2 84.4 83.6  PLT 316 276 266 272   Cardiac Enzymes:  Recent Labs Lab 05/11/15 2215  TROPONINI <0.03   BNP: BNP (last 3 results) No results for input(s): BNP in the last 8760 hours.  ProBNP (last 3 results) No results for input(s): PROBNP in the last 8760 hours.  CBG: No results for input(s): GLUCAP in the last 168 hours.     SignedMarlin Canary  [facility] 05/14/2015, 9:51 AM

## 2015-05-16 ENCOUNTER — Encounter (HOSPITAL_COMMUNITY): Payer: Self-pay | Admitting: Gastroenterology

## 2015-05-19 ENCOUNTER — Ambulatory Visit: Payer: Self-pay | Attending: Family Medicine | Admitting: Family Medicine

## 2015-05-19 ENCOUNTER — Encounter: Payer: Self-pay | Admitting: Family Medicine

## 2015-05-19 VITALS — BP 127/80 | HR 98 | Temp 98.0°F | Resp 18 | Ht 66.0 in | Wt 226.0 lb

## 2015-05-19 DIAGNOSIS — Z823 Family history of stroke: Secondary | ICD-10-CM | POA: Insufficient documentation

## 2015-05-19 DIAGNOSIS — Z23 Encounter for immunization: Secondary | ICD-10-CM | POA: Insufficient documentation

## 2015-05-19 DIAGNOSIS — R7303 Prediabetes: Secondary | ICD-10-CM | POA: Insufficient documentation

## 2015-05-19 DIAGNOSIS — Z8249 Family history of ischemic heart disease and other diseases of the circulatory system: Secondary | ICD-10-CM | POA: Insufficient documentation

## 2015-05-19 DIAGNOSIS — K219 Gastro-esophageal reflux disease without esophagitis: Secondary | ICD-10-CM | POA: Insufficient documentation

## 2015-05-19 DIAGNOSIS — Z0000 Encounter for general adult medical examination without abnormal findings: Secondary | ICD-10-CM

## 2015-05-19 DIAGNOSIS — Z833 Family history of diabetes mellitus: Secondary | ICD-10-CM | POA: Insufficient documentation

## 2015-05-19 LAB — POCT GLYCOSYLATED HEMOGLOBIN (HGB A1C): HEMOGLOBIN A1C: 6

## 2015-05-19 NOTE — Patient Instructions (Signed)

## 2015-05-19 NOTE — Progress Notes (Signed)
CC: Hospital follow up.  HPI: Angela Williams is a 44 y.o. female who was recently hospitalized at [facility] from 05/10/15-05/14/15 after she had presented with right upper quadrant pain, nausea and vomiting and had reported a previous history of gallstones.  She was placed on IV fluids, antiemetics, labs are unremarkable. Diagnostic workup included right upper quadrant ultrasound which was negative for cholelithiasis or cholecystitis and revealed fatty liver. HIDA scan was normal as well as her CT abdomen. General surgery consult placed and no surgical etiology was identified; GI consult placed and she had an upper endoscopy which was normal and a colonoscopy which revealed small internal hemorrhoids. She was commenced on IV proton pump inhibitors for presumptive gastritis versus GERD and her condition gradually improved after which she was subsequently discharged.  Today she reports doing well. Patient has No headache, No chest pain, No abdominal pain - No Nausea, No new weakness tingling or numbness, No Cough - SOB.  Allergies  Allergen Reactions  . Codeine Other (See Comments)    Will not wake up.   History reviewed. No pertinent past medical history. Current Outpatient Prescriptions on File Prior to Visit  Medication Sig Dispense Refill  . pantoprazole (PROTONIX) 40 MG tablet Take 1 tablet (40 mg total) by mouth daily. 30 tablet 0   No current facility-administered medications on file prior to visit.   Family History  Problem Relation Age of Onset  . Pulmonary embolism Mother   . Diabetes Mellitus II Mother   . Hypertension Father   . CVA Father    Social History   Social History  . Marital Status: Married    Spouse Name: N/A  . Number of Children: N/A  . Years of Education: N/A   Occupational History  . Not on file.   Social History Main Topics  . Smoking status: Never Smoker   . Smokeless tobacco: Not on file  . Alcohol Use: No  . Drug Use: No  . Sexual Activity:  Not on file   Other Topics Concern  . Not on file   Social History Narrative    Review of Systems: Constitutional: Negative for fever, chills, diaphoresis, activity change, appetite change and fatigue. HENT: Negative for ear pain, nosebleeds, congestion, facial swelling, rhinorrhea, neck pain, neck stiffness and ear discharge.  Eyes: Negative for pain, discharge, redness, itching and visual disturbance. Respiratory: Negative for cough, choking, chest tightness, shortness of breath, wheezing and stridor.  Cardiovascular: Negative for chest pain, palpitations and leg swelling. Gastrointestinal: Negative for abdominal distention. Genitourinary: Negative for dysuria, urgency, frequency, hematuria, flank pain, decreased urine volume, difficulty urinating and dyspareunia.  Musculoskeletal: Negative for back pain, joint swelling, arthralgias and gait problem. Neurological: Negative for dizziness, tremors, seizures, syncope, facial asymmetry, speech difficulty, weakness, light-headedness, numbness and headaches.  Hematological: Negative for adenopathy. Does not bruise/bleed easily. Psychiatric/Behavioral: Negative for hallucinations, behavioral problems, confusion, dysphoric mood, decreased concentration and agitation.    Objective:   Filed Vitals:   05/19/15 1026  BP: 127/80  Pulse: 98  Temp: 98 F (36.7 C)  Resp: 18    Physical Exam: Constitutional: Patient appears well-developed and well-nourished. No distress. HENT: Normocephalic, atraumatic, External right and left ear normal. Oropharynx is clear and moist.  Eyes: Conjunctivae and EOM are normal. PERRLA, no scleral icterus. Neck: Normal ROM. Neck supple. No JVD. No tracheal deviation. No thyromegaly. CVS: RRR, S1/S2 +, no murmurs, no gallops, no carotid bruit.  Pulmonary: Effort and breath sounds normal, no stridor, rhonchi, wheezes,  rales.  Abdominal: Soft. BS +,  no distension, tenderness, rebound or guarding.    Musculoskeletal: Normal range of motion. No edema and no tenderness.  Lymphadenopathy: No lymphadenopathy noted, cervical, inguinal or axillary Neuro: Alert. Normal reflexes, muscle tone coordination. No cranial nerve deficit. Skin: Skin is warm and dry. No rash noted. Not diaphoretic. No erythema. No pallor. Psychiatric: Normal mood and affect. Behavior, judgment, thought content normal.  Lab Results  Component Value Date   WBC 6.7 05/14/2015   HGB 10.3* 05/14/2015   HCT 32.0* 05/14/2015   MCV 83.6 05/14/2015   PLT 272 05/14/2015   Lab Results  Component Value Date   CREATININE 0.70 05/14/2015   BUN <5* 05/14/2015   NA 138 05/14/2015   K 3.6 05/14/2015   CL 104 05/14/2015   CO2 25 05/14/2015    Lab Results  Component Value Date   HGBA1C 6.0 05/19/2015        Assessment and plan:  GERD: Continue pantoprazole. Advised on dietary modifications.  Prediabetes: Last set of labs revealed mild hyperglycemia and A1c today is 6.0. Advised on lifestyle changes to prevent development of diabetes. Encouraged on ADA diet.   Jaclyn ShaggyEnobong, Amao, MD. [facility] [phone] 05/19/2015, 11:12 AM

## 2015-05-19 NOTE — Progress Notes (Signed)
PT's here for HFU for vomiting. Pt reports she's feeling better and able to keep food. No pain reported.  Pt requesting refill of Protonix.

## 2015-07-11 ENCOUNTER — Emergency Department (INDEPENDENT_AMBULATORY_CARE_PROVIDER_SITE_OTHER)
Admission: EM | Admit: 2015-07-11 | Discharge: 2015-07-11 | Disposition: A | Discharge status: Home / Self Care | Payer: Self-pay | Source: Home / Self Care | Attending: Family Medicine | Admitting: Family Medicine

## 2015-07-11 ENCOUNTER — Encounter (HOSPITAL_COMMUNITY): Payer: Self-pay | Admitting: Emergency Medicine

## 2015-07-11 DIAGNOSIS — J3489 Other specified disorders of nose and nasal sinuses: Secondary | ICD-10-CM

## 2015-07-11 DIAGNOSIS — J9801 Acute bronchospasm: Secondary | ICD-10-CM

## 2015-07-11 MED ORDER — IPRATROPIUM-ALBUTEROL 0.5-2.5 (3) MG/3ML IN SOLN
3.0000 mL | Freq: Once | RESPIRATORY_TRACT | Status: AC
  Administered 2015-07-11: 3 mL via RESPIRATORY_TRACT

## 2015-07-11 MED ORDER — PREDNISONE 20 MG PO TABS: ORAL_TABLET | ORAL | Status: DC

## 2015-07-11 MED ORDER — TRIAMCINOLONE ACETONIDE 40 MG/ML IJ SUSP: 40.0000 mg | Freq: Once | INTRAMUSCULAR | Status: DC

## 2015-07-11 MED ORDER — ALBUTEROL SULFATE HFA 108 (90 BASE) MCG/ACT IN AERS: 2.0000 | INHALATION_SPRAY | RESPIRATORY_TRACT | Status: DC | PRN

## 2015-07-11 MED ORDER — TRIAMCINOLONE ACETONIDE 40 MG/ML IJ SUSP
INTRAMUSCULAR | Status: AC
  Filled 2015-07-11: qty 1

## 2015-07-11 MED ORDER — IPRATROPIUM-ALBUTEROL 0.5-2.5 (3) MG/3ML IN SOLN
RESPIRATORY_TRACT | Status: AC
  Filled 2015-07-11: qty 3

## 2015-07-11 NOTE — Discharge Instructions (Signed)
Bronchospasm, Adult Allegra or Zyrtec for drainage daily. A bronchospasm is a spasm or tightening of the airways going into the lungs. During a bronchospasm breathing becomes more difficult because the airways get smaller. When this happens there can be coughing, a whistling sound when breathing (wheezing), and difficulty breathing. Bronchospasm is often associated with asthma, but not all patients who experience a bronchospasm have asthma. CAUSES  A bronchospasm is caused by inflammation or irritation of the airways. The inflammation or irritation may be triggered by:  1. Allergies (such as to animals, pollen, food, or mold). Allergens that cause bronchospasm may cause wheezing immediately after exposure or many hours later.  2. Infection. Viral infections are believed to be the most common cause of bronchospasm.  3. Exercise.  4. Irritants (such as pollution, cigarette smoke, strong odors, aerosol sprays, and paint fumes).  5. Weather changes. Winds increase molds and pollens in the air. Rain refreshes the air by washing irritants out. Cold air may cause inflammation.  6. Stress and emotional upset.  SIGNS AND SYMPTOMS   Wheezing.   Excessive nighttime coughing.   Frequent or severe coughing with a simple cold.   Chest tightness.   Shortness of breath.  DIAGNOSIS  Bronchospasm is usually diagnosed through a history and physical exam. Tests, such as chest X-rays, are sometimes done to look for other conditions. TREATMENT   Inhaled medicines can be given to open up your airways and help you breathe. The medicines can be given using either an inhaler or a nebulizer machine.  Corticosteroid medicines may be given for severe bronchospasm, usually when it is associated with asthma. HOME CARE INSTRUCTIONS   Always have a plan prepared for seeking medical care. Know when to call your health care provider and local emergency services (911 in the [place] Know where you can access  local emergency care.  Only take medicines as directed by your health care provider.  If you were prescribed an inhaler or nebulizer machine, ask your health care provider to explain how to use it correctly. Always use a spacer with your inhaler if you were given one.  It is necessary to remain calm during an attack. Try to relax and breathe more slowly.  Control your home environment in the following ways:   Change your heating and air conditioning filter at least once a month.   Limit your use of fireplaces and wood stoves.  Do not smoke and do not allow smoking in your home.   Avoid exposure to perfumes and fragrances.   Get rid of pests (such as roaches and mice) and their droppings.   Throw away plants if you see mold on them.   Keep your house clean and dust free.   Replace carpet with wood, tile, or vinyl flooring. Carpet can trap dander and dust.   Use allergy-proof pillows, mattress covers, and box spring covers.   Wash bed sheets and blankets every week in hot water and dry them in a dryer.   Use blankets that are made of polyester or cotton.   Wash hands frequently. SEEK MEDICAL CARE IF:   You have muscle aches.   You have chest pain.   The sputum changes from clear or white to yellow, green, gray, or bloody.   The sputum you cough up gets thicker.   There are problems that may be related to the medicine you are given, such as a rash, itching, swelling, or trouble breathing.  SEEK IMMEDIATE MEDICAL CARE IF:  You have worsening wheezing and coughing even after taking your prescribed medicines.   You have increased difficulty breathing.   You develop severe chest pain. MAKE SURE YOU:   Understand these instructions.  Will watch your condition.  Will get help right away if you are not doing well or get worse.   This information is not intended to replace advice given to you by your health care provider. Make sure you discuss any  questions you have with your health care provider.   Document Released: 07/12/2003 Document Revised: 07/30/2014 Document Reviewed: 12/29/2012 Elsevier Interactive Patient Education 2016 [employer]  How to Use an Inhaler Using your inhaler correctly is very important. Good technique will make sure that the medicine reaches your lungs.  HOW TO USE AN INHALER: 7. Take the cap off the inhaler. 8. If this is the first time using your inhaler, you need to prime it. Shake the inhaler for 5 seconds. Release four puffs into the air, away from your face. Ask your doctor for help if you have questions. 9. Shake the inhaler for 5 seconds. 10. Turn the inhaler so the bottle is above the mouthpiece. 11. Put your pointer finger on top of the bottle. Your thumb holds the bottom of the inhaler. 12. Open your mouth. 13. Either hold the inhaler away from your mouth (the width of 2 fingers) or place your lips tightly around the mouthpiece. Ask your doctor which way to use your inhaler. 14. Breathe out as much air as possible. 15. Breathe in and push down on the bottle 1 time to release the medicine. You will feel the medicine go in your mouth and throat. 16. Continue to take a deep breath in very slowly. Try to fill your lungs. 17. After you have breathed in completely, hold your breath for 10 seconds. This will help the medicine to settle in your lungs. If you cannot hold your breath for 10 seconds, hold it for as long as you can before you breathe out. 18. Breathe out slowly, through pursed lips. Whistling is an example of pursed lips. 19. If your doctor has told you to take more than 1 puff, wait at least 15-30 seconds between puffs. This will help you get the best results from your medicine. Do not use the inhaler more than your doctor tells you to. 20. Put the cap back on the inhaler. 21. Follow the directions from your doctor or from the inhaler package about cleaning the inhaler. If you use more than  one inhaler, ask your doctor which inhalers to use and what order to use them in. Ask your doctor to help you figure out when you will need to refill your inhaler.  If you use a steroid inhaler, always rinse your mouth with water after your last puff, gargle and spit out the water. Do not swallow the water. GET HELP IF:  The inhaler medicine only partially helps to stop wheezing or shortness of breath.  You are having trouble using your inhaler.  You have some increase in thick spit (phlegm). GET HELP RIGHT AWAY IF:  The inhaler medicine does not help your wheezing or shortness of breath or you have tightness in your chest.  You have dizziness, headaches, or fast heart rate.  You have chills, fever, or night sweats.  You have a large increase of thick spit, or your thick spit is bloody. MAKE SURE YOU:   Understand these instructions.  Will watch your condition.  Will get help  right away if you are not doing well or get worse.   This information is not intended to replace advice given to you by your health care provider. Make sure you discuss any questions you have with your health care provider.   Document Released: 04/17/2008 Document Revised: 04/29/2013 Document Reviewed: 02/05/2013 Elsevier Interactive Patient Education [employer]

## 2015-07-11 NOTE — ED Notes (Signed)
Cough started Thursday 12/15.  Patient has used cough medicine and nyquil.

## 2015-07-11 NOTE — ED Provider Notes (Signed)
CSN: [id number]     Arrival date & time 07/11/15  1651 History   First MD Initiated Contact with Patient 07/11/15 1906     Chief Complaint  Patient presents with  . Cough   (Consider location/radiation/quality/duration/timing/severity/associated sxs/prior Treatment) HPI Comments: 44 year old female complaining of a cough for 4 days. She has coughing spasms and often associated with posttussive emesis. She occasionally has a runny nose. She is unsure as to whether she feels PND. Denies chest pain but states she has shortness of breath. When lying supine at nighttime she feels as though her throat is closing off. Denies fever, chills, abdominal pain.   History reviewed. No pertinent past medical history. Past Surgical History  Procedure Laterality Date  . Ectopic pregnancy surgery    . Esophagogastroduodenoscopy N/A 05/12/2015    Procedure: ESOPHAGOGASTRODUODENOSCOPY (EGD);  Surgeon: Charlott Rakes, MD;  Location: [facility] ENDOSCOPY;  Service: Endoscopy;  Laterality: N/A;  . Colonoscopy N/A 05/13/2015    Procedure: COLONOSCOPY;  Surgeon: Charlott Rakes, MD;  Location: [facility] ENDOSCOPY;  Service: Endoscopy;  Laterality: N/A;   Family History  Problem Relation Age of Onset  . Pulmonary embolism Mother   . Diabetes Mellitus II Mother   . Hypertension Father   . CVA Father    Social History  Substance Use Topics  . Smoking status: Never Smoker   . Smokeless tobacco: None  . Alcohol Use: No   OB History    No data available     Review of Systems  Constitutional: Positive for activity change. Negative for fever.  HENT: Positive for congestion and rhinorrhea. Negative for ear pain, postnasal drip and sore throat.   Respiratory: Positive for cough and shortness of breath.   Cardiovascular: Negative for chest pain.  Gastrointestinal: Positive for vomiting. Negative for nausea and abdominal pain.  Genitourinary: Negative.   Skin: Negative.   Neurological: Negative.     Allergies   Codeine  Home Medications   Prior to Admission medications   Medication Sig Start Date End Date Taking? Authorizing Provider  Dextromethorphan HBr (COUGH RELIEF PO) Take by mouth.   Yes Historical Provider, MD  Pseudoeph-Doxylamine-DM-APAP (NYQUIL PO) Take by mouth.   Yes Historical Provider, MD  albuterol (PROVENTIL HFA;VENTOLIN HFA) 108 (90 BASE) MCG/ACT inhaler Inhale 2 puffs into the lungs every 4 (four) hours as needed for wheezing or shortness of breath. 07/11/15   Hayden Rasmussen, NP  pantoprazole (PROTONIX) 40 MG tablet Take 1 tablet (40 mg total) by mouth daily. Patient not taking: Reported on 07/11/2015 05/14/15   Joseph Art, DO  predniSONE (DELTASONE) 20 MG tablet Take 3 tabs po on first day, 2 tabs second day, 2 tabs third day, 1 tab fourth day, 1 tab 5th day. Take with food. 07/11/15   Hayden Rasmussen, NP   Meds Ordered and Administered this Visit   Medications  triamcinolone acetonide (KENALOG-40) injection 40 mg (not administered)  ipratropium-albuterol (DUONEB) 0.5-2.5 (3) MG/3ML nebulizer solution 3 mL (3 mLs Nebulization Given 07/11/15 1921)    BP 129/84 mmHg  Pulse 74  Temp(Src) 98.1 F (36.7 C) (Oral)  Resp 20  SpO2 100% No data found.   Physical Exam  Constitutional: She is oriented to person, place, and time. She appears well-developed and well-nourished. No distress.  HENT:  Mouth/Throat: No oropharyngeal exudate.  Bilateral TMs mildly retracted. Oropharynx with minor erythema, clear PND.  Eyes: Conjunctivae and EOM are normal.  Neck: Normal range of motion. Neck supple.  Cardiovascular: Normal rate, regular rhythm and normal  heart sounds.   Pulmonary/Chest: Effort normal. She has no rales.  With deep respirations and expiration reveals prolonged expiratory phase and wheezing. Deep inspiration produces coughing spasms as well as coarseness during the cough.  Musculoskeletal: She exhibits no edema.  Lymphadenopathy:    She has no cervical adenopathy.   Neurological: She is alert and oriented to person, place, and time. She exhibits normal muscle tone.  Skin: Skin is warm and dry.  Psychiatric: She has a normal mood and affect.  Nursing note and vitals reviewed.   ED Course  Procedures (including critical care time)  Labs Review Labs Reviewed - No data to display  Imaging Review No results found.   Visual Acuity Review  Right Eye Distance:   Left Eye Distance:   Bilateral Distance:    Right Eye Near:   Left Eye Near:    Bilateral Near:         MDM   1. Cough due to bronchospasm   2. Sinus drainage     Post DuoNeb patient states she feels about the same. Auscultation reveals no wheezing. Breath sounds are present and louder in all fields, inspiration equals expiration. Kenalog 40 mg IM Prednisone taper dose Rx Albuterol HFA every 4 hours Aunt histamine to take daily such as Allegra, Claritin or Zyrtec. Increase po fluids    Hayden Rasmussenavid Ryelynn Guedea, NP 07/11/15 [phone]

## 2016-05-17 ENCOUNTER — Encounter: Payer: Self-pay | Admitting: Physician Assistant

## 2016-05-17 ENCOUNTER — Ambulatory Visit: Payer: Self-pay | Attending: Family Medicine | Admitting: Physician Assistant

## 2016-05-17 VITALS — BP 118/80 | HR 86 | Temp 97.9°F | Resp 16 | Wt 233.2 lb

## 2016-05-17 DIAGNOSIS — E1165 Type 2 diabetes mellitus with hyperglycemia: Secondary | ICD-10-CM | POA: Insufficient documentation

## 2016-05-17 DIAGNOSIS — Z23 Encounter for immunization: Secondary | ICD-10-CM

## 2016-05-17 DIAGNOSIS — R7309 Other abnormal glucose: Secondary | ICD-10-CM

## 2016-05-17 DIAGNOSIS — D508 Other iron deficiency anemias: Secondary | ICD-10-CM | POA: Insufficient documentation

## 2016-05-17 DIAGNOSIS — R5383 Other fatigue: Secondary | ICD-10-CM

## 2016-05-17 DIAGNOSIS — N898 Other specified noninflammatory disorders of vagina: Secondary | ICD-10-CM | POA: Insufficient documentation

## 2016-05-17 LAB — CBC WITH DIFFERENTIAL/PLATELET
BASOS PCT: 0 %
Basophils Absolute: 0 cells/uL (ref 0–200)
EOS ABS: 0 {cells}/uL — AB (ref 15–500)
EOS PCT: 0 %
HCT: 33.5 % — ABNORMAL LOW (ref 35.0–45.0)
HEMOGLOBIN: 11 g/dL — AB (ref 11.7–15.5)
LYMPHS ABS: 1406 {cells}/uL (ref 850–3900)
Lymphocytes Relative: 19 %
MCH: 27.2 pg (ref 27.0–33.0)
MCHC: 32.8 g/dL (ref 32.0–36.0)
MCV: 82.7 fL (ref 80.0–100.0)
MONOS PCT: 6 %
MPV: 9 fL (ref 7.5–12.5)
Monocytes Absolute: 444 cells/uL (ref 200–950)
NEUTROS ABS: 5550 {cells}/uL (ref 1500–7800)
Neutrophils Relative %: 75 %
PLATELETS: 356 10*3/uL (ref 140–400)
RBC: 4.05 MIL/uL (ref 3.80–5.10)
RDW: 15 % (ref 11.0–15.0)
WBC: 7.4 10*3/uL (ref 3.8–10.8)

## 2016-05-17 LAB — POCT URINALYSIS DIPSTICK
Glucose, UA: NEGATIVE
KETONES UA: NEGATIVE
LEUKOCYTES UA: NEGATIVE
Nitrite, UA: NEGATIVE
PH UA: 5.5
PROTEIN UA: 30
UROBILINOGEN UA: 0.2

## 2016-05-17 LAB — TSH: TSH: 0.93 m[IU]/L

## 2016-05-17 LAB — FERRITIN: Ferritin: 31 ng/mL (ref 10–232)

## 2016-05-17 LAB — POCT GLYCOSYLATED HEMOGLOBIN (HGB A1C): HEMOGLOBIN A1C: 6.4

## 2016-05-17 LAB — GLUCOSE, POCT (MANUAL RESULT ENTRY): POC GLUCOSE: 129 mg/dL — AB (ref 70–99)

## 2016-05-17 MED ORDER — FLUCONAZOLE 150 MG PO TABS: 150.0000 mg | ORAL_TABLET | Freq: Once | ORAL | 0 refills | Status: AC

## 2016-05-17 NOTE — Progress Notes (Signed)
Angela Williams, is a 45 y.o. female  WUJ:[id number]:[id number]  NFA:[id number]  DOB - 04-06-1971  Subjective:  Chief Complaint and HPI: Angela Williams is a 45 y.o. female here today for vaginal d/c on and off with vaginal itching X 2 months.  She has used monistat which helped.  She hasn't used monistat in 2 weeks now.  She denies pelvic pain/abdominal pain/f/c.  She is monogamous with her husband.  They are not using birth control and they desire pregnancy.  She has had some elevated blood sugars ion the past and has been advised to watch her sugar intake.  Periods are usu irregular and heavy.  This is worse over the last few months.    She has never been diagnosed with PCOS.   She denies s/sx hyper or hypoglycemia.  She does admit to fatigue.    ROS:   Constitutional:  No f/c, No night sweats, No unexplained weight loss. EENT:  No vision changes, No blurry vision, No hearing changes. No mouth, throat, or ear problems.  Respiratory: No cough, No SOB Cardiac: No CP, no palpitations GI:  No abd pain, No N/V/D. GU: No Urinary s/sx. +vaginal d/c Musculoskeletal: No joint pain Neuro: No headache, no dizziness, no motor weakness.  Skin: No rash Endocrine:  No polydipsia. No polyuria.  Psych: Denies SI/HI  No problems updated.  ALLERGIES: Allergies  Allergen Reactions  . Codeine Other (See Comments)    Will not wake up.    PAST MEDICAL HISTORY: No past medical history on file.  MEDICATIONS AT HOME: Prior to Admission medications   Medication Sig Start Date End Date Taking? Authorizing Provider  Dextromethorphan HBr (COUGH RELIEF PO) Take by mouth.    Historical Provider, MD  fluconazole (DIFLUCAN) 150 MG tablet Take 1 tablet (150 mg total) by mouth once. Repeat in 3 days 05/17/16 05/17/16  Anders SimmondsAngela M Kwesi Sangha, PA-C  pantoprazole (PROTONIX) 40 MG tablet Take 1 tablet (40 mg total) by mouth daily. Patient not taking: Reported on 05/17/2016 05/14/15   Joseph ArtJessica U Vann, DO     Objective:  EXAM:    Vitals:   05/17/16 1027  BP: 118/80  Pulse: 86  Resp: 16  Temp: 97.9 F (36.6 C)  TempSrc: Oral  SpO2: 99%  Weight: 233 lb 3.2 oz (105.8 kg)    General appearance : A&OX3. NAD. Non-toxic-appearing HEENT: Atraumatic and Normocephalic.  PERRLA. EOM intact.  TM clear B. Mouth-MMM, post pharynx WNL w/o erythema, No PND. Neck: supple, no JVD. No cervical lymphadenopathy. No thyromegaly Chest/Lungs:  Breathing-non-labored, Good air entry bilaterally, breath sounds normal without rales, rhonchi, or wheezing  CVS: S1 S2 regular, no murmurs, gallops, rubs  Abdomen: Bowel sounds present, Non tender and not distended with no gaurding, rigidity or rebound. Pelvic/GU: speculum inseerted-vaginal mucosa is bright red with whitish thick d/c in clumps adhering to the vaginal mucosa.  Cervix WNL and w/o lesion.  Bimanual exam is unremarkable.   Extremities: Bilateral Lower Ext shows no edema, both legs are warm to touch with = pulse throughout Neurology:  CN II-XII grossly intact, Non focal.   Psych:  TP linear. J/I WNL. Normal speech. Appropriate eye contact and affect.  Skin:  No Rash  Data Review Lab Results  Component Value Date   HGBA1C 6.4 05/17/2016   HGBA1C 6.0 05/19/2015     Assessment & Plan   1. Elevated glucose Pre-diabetes/hyperglycemia/early diabetes - Glucose (CBG) - HgB A1c I have had a lengthy discussion and provided education about insulin resistance  and the intake of too much sugar/refined carbohydrates.  I have advised the patient to work at a goal of eliminating sugary drinks, candy, desserts, sweets, refined sugars, processed foods, and white carbohydrates.  The patient expresses understanding. Advised [employer] Diet eating plan.  Increase water intake. We discussed the possibility of adding metformin but will hold off at this time since she is interested in pregnancy and wants to try aggressive dietary and exercise changes first. I spent face to face with her  counseling her on the above stated and needed changes   2. Vaginal discharge-likely yeast(likely due to elevated blood sugars) - Urinalysis Dipstick - Cervicovaginal ancillary only - fluconazole (DIFLUCAN) 150 MG tablet; Take 1 tablet (150 mg total) by mouth once. Repeat in 3 days  Dispense: 2 tablet; Refill: 0  3. Other fatigue - TSH - CBC with Differential/Platelet  4. Other iron deficiency anemia (with heavy menses-likely needs iron supplementation) - CBC with Differential/Platelet - Iron - Ferritin - IBC Panel  Patient have been counseled extensively about nutrition and exercise  Return in about 3 months (around 08/17/2016) for hyperglycemia f/up and recheck A1C.  The patient was given clear instructions to go to ER or return to medical center if symptoms don't improve, worsen or new problems develop. The patient verbalized understanding. The patient was told to call to get lab results if they haven't heard anything in the next week.     Georgian Co, PA-C [facility] [place], [place] [phone]   05/17/2016, 11:21 AMPatient ID: Angela Williams, female   DOB: [birth year], 45 y.o.   MRN: [id number]

## 2016-05-17 NOTE — Patient Instructions (Addendum)
[place] Diet   Hyperglycemia Hyperglycemia occurs when the glucose (sugar) in your blood is too high. Hyperglycemia can happen for many reasons, but it most often happens to people who do not know they have diabetes or are not managing their diabetes properly.  CAUSES  Whether you have diabetes or not, there are other causes of hyperglycemia. Hyperglycemia can occur when you have diabetes, but it can also occur in other situations that you might not be as aware of, such as: Diabetes  If you have diabetes and are having problems controlling your blood glucose, hyperglycemia could occur because of some of the following reasons:  Not following your meal plan.  Not taking your diabetes medications or not taking it properly.  Exercising less or doing less activity than you normally do.  Being sick. Pre-diabetes  This cannot be ignored. Before people develop Type 2 diabetes, they almost always have "pre-diabetes." This is when your blood glucose levels are higher than normal, but not yet high enough to be diagnosed as diabetes. Research has shown that some long-term damage to the body, especially the heart and circulatory system, may already be occurring during pre-diabetes. If you take action to manage your blood glucose when you have pre-diabetes, you may delay or prevent Type 2 diabetes from developing. Stress  If you have diabetes, you may be "diet" controlled or on oral medications or insulin to control your diabetes. However, you may find that your blood glucose is higher than usual in the hospital whether you have diabetes or not. This is often referred to as "stress hyperglycemia." Stress can elevate your blood glucose. This happens because of hormones put out by the body during times of stress. If stress has been the cause of your high blood glucose, it can be followed regularly by your caregiver. That way he/she can make sure your hyperglycemia does not continue to get worse or  progress to diabetes. Steroids  Steroids are medications that act on the infection fighting system (immune system) to block inflammation or infection. One side effect can be a rise in blood glucose. Most people can produce enough extra insulin to allow for this rise, but for those who cannot, steroids make blood glucose levels go even higher. It is not unusual for steroid treatments to "uncover" diabetes that is developing. It is not always possible to determine if the hyperglycemia will go away after the steroids are stopped. A special blood test called an A1c is sometimes done to determine if your blood glucose was elevated before the steroids were started. SYMPTOMS  Thirsty.  Frequent urination.  Dry mouth.  Blurred vision.  Tired or fatigue.  Weakness.  Sleepy.  Tingling in feet or leg. DIAGNOSIS  Diagnosis is made by monitoring blood glucose in one or all of the following ways:  A1c test. This is a chemical found in your blood.  Fingerstick blood glucose monitoring.  Laboratory results. TREATMENT  First, knowing the cause of the hyperglycemia is important before the hyperglycemia can be treated. Treatment may include, but is not be limited to:  Education.  Change or adjustment in medications.  Change or adjustment in meal plan.  Treatment for an illness, infection, etc.  More frequent blood glucose monitoring.  Change in exercise plan.  Decreasing or stopping steroids.  Lifestyle changes. HOME CARE INSTRUCTIONS   Test your blood glucose as directed.  Exercise regularly. Your caregiver will give you instructions about exercise. Pre-diabetes or diabetes which comes on with stress is helped  by exercising.  Eat wholesome, balanced meals. Eat often and at regular, fixed times. Your caregiver or nutritionist will give you a meal plan to guide your sugar intake.  Being at an ideal weight is important. If needed, losing as little as 10 to 15 pounds may help improve  blood glucose levels. SEEK MEDICAL CARE IF:   You have questions about medicine, activity, or diet.  You continue to have symptoms (problems such as increased thirst, urination, or weight gain). SEEK IMMEDIATE MEDICAL CARE IF:   You are vomiting or have diarrhea.  Your breath smells fruity.  You are breathing faster or slower.  You are very sleepy or incoherent.  You have numbness, tingling, or pain in your feet or hands.  You have chest pain.  Your symptoms get worse even though you have been following your caregiver's orders.  If you have any other questions or concerns.   This information is not intended to replace advice given to you by your health care provider. Make sure you discuss any questions you have with your health care provider.   Document Released: 01/02/2001 Document Revised: 10/01/2011 Document Reviewed: 03/15/2015 Elsevier Interactive Patient Education 2016 [employer].   Tdap Vaccine (Tetanus, Diphtheria and Pertussis): What You Need to Know 1. Why get vaccinated? Tetanus, diphtheria and pertussis are very serious diseases. Tdap vaccine can protect [place] from these diseases. And, Tdap vaccine given to pregnant women can protect newborn babies against pertussis. TETANUS (Lockjaw) is rare in the [place] today. It causes painful muscle tightening and stiffness, usually all over the body.  It can lead to tightening of muscles in the head and neck so you can't open your mouth, swallow, or sometimes even breathe. Tetanus kills about 1 out of 10 people who are infected even after receiving the best medical care. DIPHTHERIA is also rare in the [place] today. It can cause a thick coating to form in the back of the throat.  It can lead to breathing problems, heart failure, paralysis, and death. PERTUSSIS (Whooping Cough) causes severe coughing spells, which can cause difficulty breathing, vomiting and disturbed sleep.  It can also lead to weight loss,  incontinence, and rib fractures. Up to 2 in 100 adolescents and 5 in 100 adults with pertussis are hospitalized or have complications, which could include pneumonia or death. These diseases are caused by bacteria. Diphtheria and pertussis are spread from person to person through secretions from coughing or sneezing. Tetanus enters the body through cuts, scratches, or wounds. Before vaccines, as many as 200,000 cases of diphtheria, 200,000 cases of pertussis, and hundreds of cases of tetanus, were reported in the [place] each year. Since vaccination began, reports of cases for tetanus and diphtheria have dropped by about 99% and for pertussis by about 80%. 2. Tdap vaccine Tdap vaccine can protect adolescents and adults from tetanus, diphtheria, and pertussis. One dose of Tdap is routinely given at age 92 or 32. People who did not get Tdap at that age should get it as soon as possible. Tdap is especially important for healthcare professionals and anyone having close contact with a baby younger than 12 months. Pregnant women should get a dose of Tdap during every pregnancy, to protect the newborn from pertussis. Infants are most at risk for severe, life-threatening complications from pertussis. Another vaccine, called Td, protects against tetanus and diphtheria, but not pertussis. A Td booster should be given every 10 years. Tdap may be given as one of these boosters if  you have never gotten Tdap before. Tdap may also be given after a severe cut or burn to prevent tetanus infection. Your doctor or the person giving you the vaccine can give you more information. Tdap may safely be given at the same time as other vaccines. 3. Some people should not get this vaccine  A person who has ever had a life-threatening allergic reaction after a previous dose of any diphtheria, tetanus or pertussis containing vaccine, OR has a severe allergy to any part of this vaccine, should not get Tdap vaccine. Tell the  person giving the vaccine about any severe allergies.  Anyone who had coma or long repeated seizures within 7 days after a childhood dose of DTP or DTaP, or a previous dose of Tdap, should not get Tdap, unless a cause other than the vaccine was found. They can still get Td.  Talk to your doctor if you:  have seizures or another nervous system problem,  had severe pain or swelling after any vaccine containing diphtheria, tetanus or pertussis,  ever had a condition called Guillain-Barr Syndrome (GBS),  aren't feeling well on the day the shot is scheduled. 4. Risks With any medicine, including vaccines, there is a chance of side effects. These are usually mild and go away on their own. Serious reactions are also possible but are rare. Most people who get Tdap vaccine do not have any problems with it. Mild problems following Tdap (Did not interfere with activities)  Pain where the shot was given (about 3 in 4 adolescents or 2 in 3 adults)  Redness or swelling where the shot was given (about 1 person in 5)  Mild fever of at least 100.78F (up to about 1 in 25 adolescents or 1 in 100 adults)  Headache (about 3 or 4 people in 10)  Tiredness (about 1 person in 3 or 4)  Nausea, vomiting, diarrhea, stomach ache (up to 1 in 4 adolescents or 1 in 10 adults)  Chills, sore joints (about 1 person in 10)  Body aches (about 1 person in 3 or 4)  Rash, swollen glands (uncommon) Moderate problems following Tdap (Interfered with activities, but did not require medical attention)  Pain where the shot was given (up to 1 in 5 or 6)  Redness or swelling where the shot was given (up to about 1 in 16 adolescents or 1 in 12 adults)  Fever over 102F (about 1 in 100 adolescents or 1 in 250 adults)  Headache (about 1 in 7 adolescents or 1 in 10 adults)  Nausea, vomiting, diarrhea, stomach ache (up to 1 or 3 people in 100)  Swelling of the entire arm where the shot was given (up to about 1 in  500). Severe problems following Tdap (Unable to perform usual activities; required medical attention)  Swelling, severe pain, bleeding and redness in the arm where the shot was given (rare). Problems that could happen after any vaccine:  People sometimes faint after a medical procedure, including vaccination. Sitting or lying down for about 15 minutes can help prevent fainting, and injuries caused by a fall. Tell your doctor if you feel dizzy, or have vision changes or ringing in the ears.  Some people get severe pain in the shoulder and have difficulty moving the arm where a shot was given. This happens very rarely.  Any medication can cause a severe allergic reaction. Such reactions from a vaccine are very rare, estimated at fewer than 1 in a million doses, and would happen within  a few minutes to a few hours after the vaccination. As with any medicine, there is a very remote chance of a vaccine causing a serious injury or death. The safety of vaccines is always being monitored. For more information, visit: http://floyd.org/www.cdc.gov/vaccinesafety/ 5. What if there is a serious problem? What should I look for?  Look for anything that concerns you, such as signs of a severe allergic reaction, very high fever, or unusual behavior.  Signs of a severe allergic reaction can include hives, swelling of the face and throat, difficulty breathing, a fast heartbeat, dizziness, and weakness. These would usually start a few minutes to a few hours after the vaccination. What should I do?  If you think it is a severe allergic reaction or other emergency that can't wait, call 9-1-1 or get the person to the nearest hospital. Otherwise, call your doctor.  Afterward, the reaction should be reported to the Vaccine Adverse Event Reporting System (VAERS). Your doctor might file this report, or you can do it yourself through the VAERS web site at www.vaers.LAgents.nohhs.gov, or by calling 1-(365) 555-6752. VAERS does not give medical  advice.  6. The National Vaccine Injury Compensation Program The [employer] Vaccine Injury Compensation Program (VICP) is a federal program that was created to compensate people who may have been injured by certain vaccines. Persons who believe they may have been injured by a vaccine can learn about the program and about filing a claim by calling 1-806-810-7806 or visiting the VICP website at SpiritualWord.atwww.hrsa.gov/vaccinecompensation. There is a time limit to file a claim for compensation. 7. How can I learn more?  Ask your doctor. He or she can give you the vaccine package insert or suggest other sources of information.  Call your local or state health department.  Contact the Centers for Disease Control and Prevention (CDC):  Call 210 466 10431-203 081 7018 (1-800-CDC-INFO) or  Visit CDC's website at PicCapture.uywww.cdc.gov/vaccines CDC Tdap Vaccine VIS (09/15/13)   This information is not intended to replace advice given to you by your health care provider. Make sure you discuss any questions you have with your health care provider.   Document Released: 01/08/2012 Document Revised: 07/30/2014 Document Reviewed: 10/21/2013 Elsevier Interactive Patient Education [employer]2016 Elsevier Inc.

## 2016-05-17 NOTE — Progress Notes (Signed)
Pt is in the office today for head pain and vaginal discharge Pt states she is not in any pain Pt states late last night she was having chest pain Pt states she was having them regulary Pt states the discharge comes after her cycle Pt states she uses monstat and it clears up but it hasn't Pt states she had intercourse a month ago

## 2016-05-18 ENCOUNTER — Other Ambulatory Visit: Payer: Self-pay | Admitting: Physician Assistant

## 2016-05-18 LAB — CERVICOVAGINAL ANCILLARY ONLY
Chlamydia: NEGATIVE
NEISSERIA GONORRHEA: NEGATIVE
Wet Prep (BD Affirm): POSITIVE — AB

## 2016-05-18 LAB — IBC PANEL
%SAT: 10 % — ABNORMAL LOW (ref 11–50)
TIBC: 354 ug/dL (ref 250–450)
UIBC: 319 ug/dL (ref 125–400)

## 2016-05-18 LAB — IRON: IRON: 35 ug/dL — AB (ref 40–190)

## 2016-05-18 MED ORDER — FERROUS SULFATE DRIED ER 160 (50 FE) MG PO TBCR: 160.0000 mg | EXTENDED_RELEASE_TABLET | Freq: Every day | ORAL | 3 refills | Status: AC

## 2016-05-23 ENCOUNTER — Telehealth: Payer: Self-pay

## 2016-05-23 NOTE — Telephone Encounter (Signed)
Contacted pt to go over lab results pt is aware of results and doesn't have any questions or concerns
